# Patient Record
Sex: Female | Born: 1985 | Race: White | Hispanic: Yes | Marital: Married | Smoking: Never smoker
Health system: Southern US, Community
[De-identification: ages and names within clinical notes are randomized; demographics above are authoritative.]

## PROBLEM LIST (undated history)

## (undated) DIAGNOSIS — I959 Hypotension, unspecified: Secondary | ICD-10-CM

## (undated) HISTORY — DX: Hypotension, unspecified: I95.9

---

## 2008-06-28 ENCOUNTER — Encounter: Payer: Self-pay | Admitting: Family Medicine

## 2008-06-28 ENCOUNTER — Ambulatory Visit: Payer: Self-pay | Admitting: Family Medicine

## 2008-06-28 LAB — CONVERTED CEMR LAB
Antibody Screen: NEGATIVE
Basophils Relative: 0 % (ref 0–1)
Eosinophils Absolute: 0 10*3/uL (ref 0.0–0.7)
Eosinophils Relative: 0 % (ref 0–5)
MCHC: 32.2 g/dL (ref 30.0–36.0)
MCV: 91.9 fL (ref 78.0–100.0)
Monocytes Relative: 7 % (ref 3–12)
Neutrophils Relative %: 75 % (ref 43–77)
Platelets: 193 10*3/uL (ref 150–400)
RBC: 4.32 M/uL (ref 3.87–5.11)

## 2008-07-05 ENCOUNTER — Ambulatory Visit: Payer: Self-pay | Admitting: Family Medicine

## 2008-07-05 ENCOUNTER — Encounter: Payer: Self-pay | Admitting: Family Medicine

## 2008-07-05 LAB — CONVERTED CEMR LAB
Chlamydia, DNA Probe: NEGATIVE
Pap Smear: NORMAL

## 2008-08-03 ENCOUNTER — Ambulatory Visit: Payer: Self-pay | Admitting: Family Medicine

## 2008-08-03 LAB — CONVERTED CEMR LAB: Glucose, Urine, Semiquant: NEGATIVE

## 2008-08-31 ENCOUNTER — Ambulatory Visit: Payer: Self-pay | Admitting: Family Medicine

## 2008-08-31 ENCOUNTER — Encounter: Payer: Self-pay | Admitting: Family Medicine

## 2008-09-25 ENCOUNTER — Ambulatory Visit: Payer: Self-pay | Admitting: Family Medicine

## 2008-09-25 ENCOUNTER — Encounter: Payer: Self-pay | Admitting: Family Medicine

## 2008-10-22 ENCOUNTER — Encounter: Payer: Self-pay | Admitting: Family Medicine

## 2008-10-22 ENCOUNTER — Ambulatory Visit: Payer: Self-pay | Admitting: Family Medicine

## 2008-11-05 ENCOUNTER — Ambulatory Visit: Payer: Self-pay | Admitting: Family Medicine

## 2008-11-12 ENCOUNTER — Ambulatory Visit: Payer: Self-pay | Admitting: Family Medicine

## 2008-11-12 ENCOUNTER — Encounter: Payer: Self-pay | Admitting: Family Medicine

## 2008-11-19 ENCOUNTER — Encounter: Payer: Self-pay | Admitting: Family Medicine

## 2008-11-19 ENCOUNTER — Ambulatory Visit: Payer: Self-pay | Admitting: Family Medicine

## 2008-11-26 ENCOUNTER — Encounter: Payer: Self-pay | Admitting: Family Medicine

## 2008-11-26 ENCOUNTER — Ambulatory Visit: Payer: Self-pay | Admitting: Family Medicine

## 2008-11-26 DIAGNOSIS — Q638 Other specified congenital malformations of kidney: Secondary | ICD-10-CM

## 2008-11-28 ENCOUNTER — Encounter: Payer: Self-pay | Admitting: Family Medicine

## 2008-11-28 ENCOUNTER — Ambulatory Visit (HOSPITAL_COMMUNITY): Admission: RE | Admit: 2008-11-28 | Discharge: 2008-11-28 | Payer: Self-pay | Admitting: Family Medicine

## 2008-12-04 ENCOUNTER — Encounter: Payer: Self-pay | Admitting: Family Medicine

## 2008-12-04 ENCOUNTER — Ambulatory Visit: Payer: Self-pay | Admitting: Family Medicine

## 2008-12-04 LAB — CONVERTED CEMR LAB: GC Probe Amp, Genital: NEGATIVE

## 2008-12-11 ENCOUNTER — Ambulatory Visit: Payer: Self-pay | Admitting: Family Medicine

## 2008-12-11 ENCOUNTER — Encounter: Payer: Self-pay | Admitting: Family Medicine

## 2008-12-17 ENCOUNTER — Encounter: Payer: Self-pay | Admitting: Family Medicine

## 2008-12-17 ENCOUNTER — Ambulatory Visit: Payer: Self-pay | Admitting: Family Medicine

## 2008-12-17 DIAGNOSIS — Z22330 Carrier of Group B streptococcus: Secondary | ICD-10-CM | POA: Insufficient documentation

## 2008-12-19 ENCOUNTER — Ambulatory Visit (HOSPITAL_COMMUNITY): Admission: RE | Admit: 2008-12-19 | Discharge: 2008-12-19 | Payer: Self-pay | Admitting: Family Medicine

## 2008-12-19 ENCOUNTER — Encounter: Payer: Self-pay | Admitting: Family Medicine

## 2008-12-26 ENCOUNTER — Inpatient Hospital Stay (HOSPITAL_COMMUNITY): Admission: AD | Admit: 2008-12-26 | Discharge: 2008-12-28 | Payer: Self-pay | Admitting: Obstetrics & Gynecology

## 2008-12-26 ENCOUNTER — Ambulatory Visit: Payer: Self-pay | Admitting: Family Medicine

## 2009-02-06 ENCOUNTER — Ambulatory Visit: Payer: Self-pay | Admitting: Family Medicine

## 2009-04-30 ENCOUNTER — Ambulatory Visit: Payer: Self-pay | Admitting: Family Medicine

## 2009-04-30 DIAGNOSIS — K089 Disorder of teeth and supporting structures, unspecified: Secondary | ICD-10-CM | POA: Insufficient documentation

## 2011-01-21 ENCOUNTER — Encounter: Payer: Self-pay | Admitting: *Deleted

## 2011-03-16 LAB — CBC
HCT: 29.3 % — ABNORMAL LOW (ref 36.0–46.0)
Hemoglobin: 10 g/dL — ABNORMAL LOW (ref 12.0–15.0)
Hemoglobin: 12.4 g/dL (ref 12.0–15.0)
MCHC: 33.3 g/dL (ref 30.0–36.0)
MCV: 89.1 fL (ref 78.0–100.0)
RBC: 3.29 MIL/uL — ABNORMAL LOW (ref 3.87–5.11)
RBC: 4.19 MIL/uL (ref 3.87–5.11)
WBC: 11.9 10*3/uL — ABNORMAL HIGH (ref 4.0–10.5)
WBC: 9.3 10*3/uL (ref 4.0–10.5)

## 2011-03-16 LAB — RPR: RPR Ser Ql: NONREACTIVE

## 2013-11-30 NOTE — L&D Delivery Note (Signed)
Patient is 28 y.o. G2P1001 132w4d by redating 5230w2d sono admitted after precipitous delivery in car   Delivery Note At 5:45 PM a viable female was delivered via Vaginal, Spontaneous Delivery prior to presentation in car on way to MAU.  APGAR: 9, 9; weight  pending Placenta status: Intact, Spontaneous.  Cord: 3 vessels with the following complications:  Anesthesia: None  Episiotomy: None Lacerations: None Suture Repair: none Est. Blood Loss (mL):  200  Mom to postpartum.  Baby to Couplet care / Skin to Skin.  Rebecca Schneider ROCIO 11/01/2014, 7:07 PM

## 2014-03-07 ENCOUNTER — Emergency Department (INDEPENDENT_AMBULATORY_CARE_PROVIDER_SITE_OTHER)
Admission: EM | Admit: 2014-03-07 | Discharge: 2014-03-07 | Disposition: A | Discharge status: Home / Self Care | Payer: Self-pay | Source: Home / Self Care

## 2014-03-07 ENCOUNTER — Encounter (HOSPITAL_COMMUNITY): Payer: Self-pay | Admitting: Emergency Medicine

## 2014-03-07 DIAGNOSIS — K5900 Constipation, unspecified: Secondary | ICD-10-CM

## 2014-03-07 DIAGNOSIS — Z3201 Encounter for pregnancy test, result positive: Secondary | ICD-10-CM

## 2014-03-07 DIAGNOSIS — R109 Unspecified abdominal pain: Secondary | ICD-10-CM

## 2014-03-07 LAB — POCT URINALYSIS DIP (DEVICE)
Bilirubin Urine: NEGATIVE
GLUCOSE, UA: NEGATIVE mg/dL
Hgb urine dipstick: NEGATIVE
Ketones, ur: NEGATIVE mg/dL
LEUKOCYTES UA: NEGATIVE
NITRITE: NEGATIVE
Protein, ur: NEGATIVE mg/dL
Specific Gravity, Urine: 1.015 (ref 1.005–1.030)
UROBILINOGEN UA: 0.2 mg/dL (ref 0.0–1.0)
pH: 7 (ref 5.0–8.0)

## 2014-03-07 LAB — POCT PREGNANCY, URINE: PREG TEST UR: POSITIVE — AB

## 2014-03-07 MED ORDER — PRENATAL VITAMINS 28-0.8 MG PO TABS: ORAL_TABLET | ORAL | Status: DC

## 2014-03-07 NOTE — ED Provider Notes (Signed)
CSN: [id number]     Arrival date & time 03/07/14  1054 History   First MD Initiated Contact with Patient 03/07/14 1231     Chief Complaint  Patient presents with  . Possible Pregnancy   (Consider location/radiation/quality/duration/timing/severity/associated sxs/prior Treatment) HPI Comments: As above. Only complaint is lower abdominal cramping for several days. Has not has a nl BM for several days. No pelvic pain or bleeding.    History reviewed. No pertinent past medical history. History reviewed. No pertinent past surgical history. No family history on file. History  Substance Use Topics  . Smoking status: Never Smoker   . Smokeless tobacco: Not on file  . Alcohol Use: No   OB History   Grav Para Term Preterm Abortions TAB SAB Ect Mult Living                 Review of Systems  Constitutional: Negative.   Gastrointestinal: Positive for abdominal pain, diarrhea and constipation. Negative for vomiting.       As per history of present illness  Genitourinary: Negative.  Negative for dysuria, frequency and pelvic pain.  Skin: Negative for rash.    Allergies  Review of patient's allergies indicates no known allergies.  Home Medications   Current Outpatient Rx  Name  Route  Sig  Dispense  Refill  . amoxicillin (AMOXIL) 500 MG tablet   Oral   Take 500 mg by mouth 2 (two) times daily.           . norethindrone (ORTHO MICRONOR) 0.35 MG tablet   Oral   Take 1 tablet by mouth daily.           . Prenatal Vit-Fe Fumarate-FA (GNP PRENATAL VITAMINS PO)   Oral   Take 1 tablet by mouth 2 (two) times daily.           . Prenatal Vit-Fe Fumarate-FA (PRENATAL VITAMINS) 28-0.8 MG TABS      1 po q d   100 tablet   0    BP 99/57  Pulse 72  Temp(Src) 98.2 F (36.8 C) (Oral)  Resp 18  SpO2 100%  LMP 01/14/2014 Physical Exam  Nursing note and vitals reviewed. Constitutional: She is oriented to person, place, and time. She appears well-developed and well-nourished.   Pulmonary/Chest: Effort normal. No respiratory distress.  Abdominal: Soft. Bowel sounds are normal. She exhibits no distension and no mass. There is no rebound and no guarding.  Minor tenderness across lower abdomen. No pelvic tenderness.  Musculoskeletal: She exhibits no edema and no tenderness.  Neurological: She is alert and oriented to person, place, and time.  Skin: Skin is warm and dry. No pallor.    ED Course  Procedures (including critical care time) Labs Review Labs Reviewed  POCT PREGNANCY, URINE - Abnormal; Notable for the following:    Preg Test, Ur POSITIVE (*)    All other components within normal limits  POCT URINALYSIS DIP (DEVICE)   Imaging Review No results found.   MDM   1. Positive pregnancy test   2. Constipation      Miralax and plenty of liquids Prenatal vit. F/U with OB, clal for appt For problems, bleeding , pain [facility] other [facility]    Sidonie Dexheimer, NP 03/07/14 1307

## 2014-03-07 NOTE — ED Notes (Signed)
Pt is here for poss preg LMP = 01/14/14??? Hx of irregular periods Will have occasional abd cramps.  Alert w/no signs of acute distress.

## 2014-03-07 NOTE — Discharge Instructions (Signed)
Constipacin (Constipation) Se llama constipacin cuando:   Elimina heces (mueve el intestino) menos de 3 veces por semana.  Tiene dificultad para mover el intestino.  Las heces son secas y duras o son ms grandes que lo normal. CUIDADOS EN EL HOGAR   Consuma ms fibra que se encuentra en frutas, verduras y granos enteros como arroz integral y frijoles.  Consuma menos alimentos ricos en grasas y azcar. Estos incluyen patatas fritas, hamburguesas, galletas, dulces y refrescos.  Si no consume suficientes alimentos ricos en fibras, tome productos que tengan agregado de fibra (suplementos).  Beba gran cantidad de lquido para mantener el pis (orina) de tono claro o amarillo plido.  Vaya al bao cuando sienta la necesidad de ir. No espere.  Slo debe tomar los [employer] se los han recetado. No tome medicamentos que le ayuden a Licensed conveyancer intestino (laxantes) sin antes consultarlo con su mdico.  Haga ejercicio en forma regular, o como lo indique su mdico. SOLICITE AYUDA DE INMEDIATO SI:   Observa sangre brillante en las heces (materia fecal).  El estreimiento dura ms de 4 das o [place].  Siente dolor en el vientre (abdomen) o en el ano (recto).  Las heces son delgadas (como un lpiz).  Pierde peso de [place] inexplicable. ASEGRESE DE QUE:   Comprende estas instrucciones.  Controlar su enfermedad.  Solicitar ayuda de inmediato si no mejora o si empeora. Document Released: 12/19/2010 Document Revised: 02/08/2012 [facility] Patient Information 2014 [place], [place].  Contenido de [place] de los alimentos ([employer] in Foods) Beber lquidos en abundancia y consumir alimentos ricos en fibra ayuda a combatir la constipacin. A continuacin podr observar el contenido de fibra de algunos alimentos.  Almidones y granos / Media planner (g)  Cheerios, 1 taza / 3 g  [employer], 1 taza / 0.7 g  Rice Krispies, 1  taza / 0.3 g  Quaker Oat Life Cereal,   taza / 2.1 g  Avena instantnea (cocida),  taza / 2 g  Kellogg's Frosted Mini Wheats, 1 taza / 5.1 g  Arroz marrn grano largo (cocido), 1 taza / 3,5 g  Arroz blanco grano largo (cocido), 1 taza / 0,6 g  Macarrones enriquecidos (cocidos), 1 taza / 2,5 g Legumbres / Fibra Diettica (g)  Frijoles cocidos (enlatados, crudos o vegetarianos),  taza / 5,2 g  Frijoles rin, enlatados,  taza / 6,8 g  Judas pinto, (cocidas),  taza / 7,7 g  Frijoles pintos (enlatados),  taza / 5,5 g Panes y Gaffer / Media planner (g)  Galletas de [place], simples o de miel, 2 cuadrados / 0,7 g  Galletas saladas, 3 unidades / 0,3 g  Pretzels, sencillos, salados, 10 trozos / 1,8 g  Pan integral, 1 rebanada / 1,9 g  Pan blanco, 1 rebanada / 0,7 g  Pan con pasas, 1 rebanada / 1,2 g  Bagel, sencillo, 3 onzas / 2 g  Tortilla de harina, 1 onza / 0,9 g  Tortilla de maz, 1 pequea / 1,5 g  Bun, hamburguesa o hot dog, 1 pequeo / 0,9 g Frutas / Fibra diettica (g)  Manzana, cruda con piel, 1 mediana / 4,4 g  Pur de manzanas, endulzado  taza / 1,5 g  [place],  mediano / 1,5 g  Uvas, 10 uvas / 0,4 g  Naranja, 1 pequea / 2,3 g  Pasas, 1,5 oz / 1,6 g  Meln, 1 taza / 1,4 g Vegetales / Fibra Diettica (g)  Judas verdes (en conserva),  taza /  1,3 g  Zanahorias (cocido),  taza / 2,3 g  Broccoli (cocido),  taza / 2,8 g  Guisantes congelados (cocidos),  taza / 4,4 g  Pur de papas,  taza / 1,6 g  [place], 1 taza / 0,5 g  Maz (en lata),  taza / 1,6 g  Tomate,  taza / 1,1 g 1 cup / 3 g. Document Released: 03/13/2013 [facility] Patient Information 2014 [place], [place].  Pruebas de Psychiatrist  (Pregnancy Tests) CMO FUNCIONAN LAS PRUEBAS DE [facility]?  Todas las pruebas de Psychiatrist buscan una hormona especial en la orina o en la sangre que slo est presente en las mujeres embarazadas. Esta hormona, la gonadotropina corinica humana (hCG), tambin se conoce como la  hormona del [place].  CUL ES LA DIFERENCIA ENTRE UNA PRUEBA DE EMBARAZO DE ORINA Y UNA PRUEBA EMBARAZO EN SANGRE? ES UNA MEJOR QUE LA OTRA?  Hay dos tipos de pruebas de [place].   Pruebas de [place].  Pruebas de [place]. En ambas se busca la presencia de hCG, la hormona del embarazo. Muchas mujeres se hacen una prueba de [place] o una prueba casera de embarazo (HPT) para averiguar si estn embarazadas. Las HPT son econmicas, fciles de usar, se pueden hacer en casa y son privadas. Cuando una mujer tiene un resultado positivo en una HPT, debe ver a su mdico inmediatamente. El mdico confirmar el resultado positivo de la HPT con otro anlisis de [place], anlisis de [place], [place] y un examen plvico.  Hay dos tipos de anlisis de sangre que el mdico puede indicar.   Un anlisis de sangre cuantitativo (o prueba de hCG beta). Esta prueba mide la cantidad exacta de hCG en la sangre Esto significa que puede detectar cantidades muy pequeas de hCG, por lo que es una prueba muy precisa.  Anlisis de sangre hCG cualitativo. Esta prueba slo confirma o no el embarazo. Se asemeja ms a la prueba de [place] en cuanto a su exactitud. Los [employer] Corporation de sangre pueden Engineer, manufacturing la hCG antes que las pruebas de [place]. Los [employer] Corporation de sangre pueden Engineer, manufacturing un Psychiatrist de unos 6 a 8 das despus de liberar un vulo del ovario (ovular). Los anlisis de [place] pueden Land alrededor de 2 semanas despus de la ovulacin. Algunas pruebas de orina ms sensibles pueden decir si usted est embarazada hasta 6 das o an hasta 1 da despus de la falta del perodo menstrual. CMO SE HACE UNA PRUEBA DE EMBARAZO EN CASA?  Hay muchos tipos de pruebas de embarazo caseras o HPT que se pueden comprar sin receta en las farmacias.   Algunas consisten en recolectar la orina en un recipiente y sumergir una tira reactiva en la orina y otros en poner un poco de orina en un recipiente especial con un gotero.  En otros  casos se coloca una tira reactiva en el flujo de orina.  Las pruebas varan segn el tiempo de espera en que la tira reactiva o el contenedor tomen un cierto color o aparezca un smbolo en ella (como un smbolo ms o un smbolo menos).  Todos los envases vienen con instrucciones escritas. La mayora de los envases tambin traen nmeros de telfono gratuitos para Freight forwarder en caso de duda sobre cmo hacer la prueba o leer los [place]. QU PRECISIN TIENEN LAS PRUEBAS DE EMBARAZO CASERAS?  Las HPT son muy precisas. La Harley-Davidson de las marcas de HPTs afirman que tienen entre el 97% y 99% de precisin cuando se realizan 1 semana despus de la  falta del perodo menstrual, pero esto puede variar con el uso real. Cada marca vara en el grado de sensibilidad para detectar la hormona del embarazo hCG. Si la prueba no se realiza correctamente, ser Apple Computermenos precisa. Controle siempre el envase para asegurarse de que no ha pasado la fecha de vencimiento. En este caso no ser segura. La mayora de las marcas aconsejan volver a Radio producerhacer la prueba despus de [address] D[address] Roadalgunos das, no importa cules sean los [place] Southresultados.  Si hace la prueba demasiado temprano en el embarazo, puede ocurrir que no haya suficiente cantidad de hormona del embarazo hCG en la orina para obtener un resultado positivo. La mayora de HPTs sern precisos si realiza la prueba en la poca de la primera falta (aproximadamente 2 semanas despus de la ovulacin). Si no est embarazada, o si ha ovulado antes de lo que supona, obtendr un resultado negativo. Tambin puede haber problemas en el embarazo que afecten la cantidad de hCG que hay en la orina. Si el HPT es negativo, repita la prueba en unos das o en [place]una semana. Si an el resultado es negativo y piensa que est embarazada, hable con su mdico inmediatamente acerca de cmo realizar una prueba de Retail bankerembarazo en sangre.  PRUEBA DE EMBARAZO FALSO POSITIVA  Una HPT falso positiva ocurre cuando hay sangre o protenas en  la orina. Puede haber falso positivo tambin si estuvo embarazada recientemente o si realiza la prueba de embarazo antes de [place], despus de haber utilizado un medicamento para la fertilidad que contenga hCG. Adems, algunos medicamentos recetados, como los diurticos, tranquilizantes, medicamentos psiquitricos, para las convulsiones, la [place]alergia, las nuseas (prometazina) dan falsos positivos.  PRUEBA DE EMBARAZO FALSO NEGATIVA   Una HPT falso negativa puede ocurrir si realiza la prueba Lear Corporationdemasiado pronto. Trate de esperar hasta que tenga al menos 1 da de retraso de su periodo menstrual.  Tambin puede ocurrir si espera demasiado tiempo para utilizar la orina ( ms de 15 minutos).  Tambin si la orina est demasiado diluida por haber bebido mucho lquido antes de obtener la [place] Antillesmuestra de orina. Lo mejor es Boston Scientificutilizar la primera orina de la maana despus de levantarse de la cama. Si su periodo menstrual no se inici despus de una semana de un HPT negativa, repita la prueba de embarazo.  CUALQUIER CIRCUNSTANCIA PUEDE INTERFERIR CON LOS RESULTADOS DEL TEST DE EMBARAZO?  La Harley-Davidsonmayora de los medicamentos de venta libre como los recetados, incluyendo las pldoras anticonceptivas y los antibiticos, no Geologist, engineeringdeberan afectar los resultados de [place] HPT. Slo los medicamentos que contienen hormona del embarazo hCG pueden dar un resultado falso positivo. Los medicamentos que contienen hCG se pueden usar para el tratamiento de la infertilidad (imposibilidad para quedar embarazada). El alcohol y las drogas no afectan los [place]resultados del HPT, pero no debera usar estas sustancias si est tratando de quedar embarazada. Si usted tiene una prueba de [place]tonembarazo positiva, llame a su mdico para hacer una cita y comenzar el cuidado prenatal. Document Released: 11/02/2012 [facility] Patient Information 2014 [place], [place].

## 2014-03-08 NOTE — ED Provider Notes (Signed)
Medical screening examination/treatment/procedure(s) were performed by non-physician practitioner and as supervising physician I was immediately available for consultation/collaboration.  Daivion Pape, M.D.  Ardel Jagger C Sahira Cataldi, MD 03/08/14 0752 

## 2014-04-04 ENCOUNTER — Ambulatory Visit (INDEPENDENT_AMBULATORY_CARE_PROVIDER_SITE_OTHER): Payer: No Typology Code available for payment source | Admitting: Advanced Practice Midwife

## 2014-04-04 ENCOUNTER — Encounter: Payer: Self-pay | Admitting: Advanced Practice Midwife

## 2014-04-04 VITALS — BP 94/57 | HR 85 | Temp 97.1°F | Wt 119.8 lb

## 2014-04-04 DIAGNOSIS — Z3490 Encounter for supervision of normal pregnancy, unspecified, unspecified trimester: Secondary | ICD-10-CM

## 2014-04-04 DIAGNOSIS — Z3687 Encounter for antenatal screening for uncertain dates: Secondary | ICD-10-CM

## 2014-04-04 DIAGNOSIS — Z1389 Encounter for screening for other disorder: Secondary | ICD-10-CM

## 2014-04-04 DIAGNOSIS — Z3480 Encounter for supervision of other normal pregnancy, unspecified trimester: Secondary | ICD-10-CM

## 2014-04-04 DIAGNOSIS — Z23 Encounter for immunization: Secondary | ICD-10-CM

## 2014-04-04 LAB — POCT URINALYSIS DIP (DEVICE)
Bilirubin Urine: NEGATIVE
GLUCOSE, UA: NEGATIVE mg/dL
Hgb urine dipstick: NEGATIVE
Ketones, ur: NEGATIVE mg/dL
Leukocytes, UA: NEGATIVE
Nitrite: NEGATIVE
PH: 7.5 (ref 5.0–8.0)
PROTEIN: NEGATIVE mg/dL
SPECIFIC GRAVITY, URINE: 1.02 (ref 1.005–1.030)
Urobilinogen, UA: 0.2 mg/dL (ref 0.0–1.0)

## 2014-04-04 NOTE — Progress Notes (Signed)
New OB.  See Smart Set   Subjective:    Rebecca Schneider is a G2P1001 3862w4d being seen today for her first obstetrical visit.  Her obstetrical history is significant for previous baby with bilateral hydronephrosis. Patient does intend to breast feed. Pregnancy history fully reviewed.  Patient reports no complaints.  Dates unsure.  Never had regular periods prior to pregnancy.   Filed Vitals:   04/04/14 0930  BP: 94/57  Pulse: 85  Temp: 97.1 F (36.2 C)  Weight: 119 lb 12.8 oz (54.341 kg)    HISTORY: OB History  Gravida Para Term Preterm AB SAB TAB Ectopic Multiple Living  2 1 1       1     # Outcome Date GA Lbr Len/2nd Weight Sex Delivery Anes PTL Lv  2 CUR           1 TRM 12/26/08 6045w0d  7 lb 2 oz (3.232 kg) M SVD EPI  Y     History reviewed. No pertinent past medical history. History reviewed. No pertinent past surgical history. Family History  Problem Relation Age of Onset  . Cancer Brother     neck  . Diabetes Maternal Grandfather      Exam    Uterus:  Fundal Height: 12 cm  Uterus measures 10-12 weeks  Pelvic Exam:    Perineum: No Hemorrhoids, Normal Perineum   Vulva: Bartholin's, Urethra, Skene's normal   Vagina:  normal mucosa, normal discharge   pH:    Cervix: no cervical motion tenderness   Adnexa: no mass, fullness, tenderness   Bony Pelvis: gynecoid  System: Breast:  normal appearance, no masses or tenderness   Skin: normal coloration and turgor, no rashes    Neurologic: oriented, grossly non-focal   Extremities: normal strength, tone, and muscle mass   HEENT neck supple with midline trachea   Mouth/Teeth mucous membranes moist, pharynx normal without lesions   Neck supple and no masses   Cardiovascular: regular rate and rhythm, no murmurs or gallops   Respiratory:  appears well, vitals normal, no respiratory distress, acyanotic, normal RR, ear and throat exam is normal, neck free of mass or lymphadenopathy, chest clear, no wheezing, crepitations,  rhonchi, normal symmetric air entry   Abdomen: soft, non-tender; bowel sounds normal; no masses,  no organomegaly   Urinary: urethral meatus normal      Assessment:    Pregnancy: G2P1001 Patient Active Problem List   Diagnosis Date Noted  . DENTAL PAIN 04/30/2009  . VAGINAL DELIVERY, WITH SECOND-DEGREE PERINEAL LACERATION 04/30/2009  . 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 Prenatal vitamins. Problem list reviewed and updated. Genetic Screening discussed First Screen: declined.  Ultrasound discussed; fetal survey: requested. In order to confirm dates  Follow up in 4 weeks. 50% of 30 min visit spent on counseling and coordination of care.     Aviva SignsMarie L Yancarlos Berthold 04/04/2014

## 2014-04-04 NOTE — Progress Notes (Signed)
In the past weeks continuous pain in lower back and goes around to the front.

## 2014-04-04 NOTE — Patient Instructions (Signed)
Pregnancy - First Trimester  During sexual intercourse, millions of sperm go into the vagina. Only 1 sperm will penetrate and fertilize the female egg while it is in the Fallopian tube. One week later, the fertilized egg implants into the wall of the uterus. An embryo begins to develop into a baby. At 6 to 8 weeks, the eyes and face are formed and the heartbeat can be seen on ultrasound. At the end of 12 weeks (first trimester), all the baby's organs are formed. Now that you are pregnant, you will want to do everything you can to have a healthy baby. Two of the most important things are to get good prenatal care and follow your caregiver's instructions. Prenatal care is all the medical care you receive before the baby's birth. It is given to prevent, find, and treat problems during the pregnancy and childbirth.  PRENATAL EXAMS  · During prenatal visits, your weight, blood pressure, and urine are checked. This is done to make sure you are healthy and progressing normally during the pregnancy.  · A pregnant woman should gain 25 to 35 pounds during the pregnancy. However, if you are overweight or underweight, your caregiver will advise you regarding your weight.  · Your caregiver will ask and answer questions for you.  · Blood work, cervical cultures, other necessary tests, and a Pap test are done during your prenatal exams. These tests are done to check on your health and the probable health of your baby. Tests are strongly recommended and done for HIV with your permission. This is the virus that causes AIDS. These tests are done because medicines can be given to help prevent your baby from being born with this infection should you have been infected without knowing it. Blood work is also used to find out your blood type, previous infections, and follow your blood levels (hemoglobin).  · Low hemoglobin (anemia) is common during pregnancy. Iron and vitamins are given to help prevent this. Later in the pregnancy, blood  tests for diabetes will be done along with any other tests if any problems develop.  · You may need other tests to make sure you and the baby are doing well.  CHANGES DURING THE FIRST TRIMESTER   Your body goes through many changes during pregnancy. They vary from person to person. Talk to your caregiver about changes you notice and are concerned about. Changes can include:  · Your menstrual period stops.  · The egg and sperm carry the genes that determine what you look like. Genes from you and your partner are forming a baby. The female genes determine whether the baby is a boy or a girl.  · Your body increases in girth and you may feel bloated.  · Feeling sick to your stomach (nauseous) and throwing up (vomiting). If the vomiting is uncontrollable, call your caregiver.  · Your breasts will begin to enlarge and become tender.  · Your nipples may stick out more and become darker.  · The need to urinate more. Painful urination may mean you have a bladder infection.  · Tiring easily.  · Loss of appetite.  · Cravings for certain kinds of food.  · At first, you may gain or lose a couple of pounds.  · You may have changes in your emotions from day to day (excited to be pregnant or concerned something may go wrong with the pregnancy and baby).  · You may have more vivid and strange dreams.  HOME CARE INSTRUCTIONS   ·   It is very important to avoid all smoking, alcohol and non-prescribed drugs during your pregnancy. These affect the formation and growth of the baby. Avoid chemicals while pregnant to ensure the delivery of a healthy infant.  · Start your prenatal visits by the 12th week of pregnancy. They are usually scheduled monthly at first, then more often in the last 2 months before delivery. Keep your caregiver's appointments. Follow your caregiver's instructions regarding medicine use, blood and lab tests, exercise, and diet.  · During pregnancy, you are providing food for you and your baby. Eat regular, well-balanced  meals. Choose foods such as meat, fish, milk and other low fat dairy products, vegetables, fruits, and whole-grain breads and cereals. Your caregiver will tell you of the ideal weight gain.  · You can help morning sickness by keeping soda crackers at the bedside. Eat a couple before arising in the morning. You may want to use the crackers without salt on them.  · Eating 4 to 5 small meals rather than 3 large meals a day also may help the nausea and vomiting.  · Drinking liquids between meals instead of during meals also seems to help nausea and vomiting.  · A physical sexual relationship may be continued throughout pregnancy if there are no other problems. Problems may be early (premature) leaking of amniotic fluid from the membranes, vaginal bleeding, or belly (abdominal) pain.  · Exercise regularly if there are no restrictions. Check with your caregiver or physical therapist if you are unsure of the safety of some of your exercises. Greater weight gain will occur in the last 2 trimesters of pregnancy. Exercising will help:  · Control your weight.  · Keep you in shape.  · Prepare you for labor and delivery.  · Help you lose your pregnancy weight after you deliver your baby.  · Wear a good support or jogging bra for breast tenderness during pregnancy. This may help if worn during sleep too.  · Ask when prenatal classes are available. Begin classes when they are offered.  · Do not use hot tubs, steam rooms, or saunas.  · Wear your seat belt when driving. This protects you and your baby if you are in an accident.  · Avoid raw meat, uncooked cheese, cat litter boxes, and soil used by cats throughout the pregnancy. These carry germs that can cause birth defects in the baby.  · The first trimester is a good time to visit your dentist for your dental health. Getting your teeth cleaned is okay. Use a softer toothbrush and brush gently during pregnancy.  · Ask for help if you have financial, counseling, or nutritional needs  during pregnancy. Your caregiver will be able to offer counseling for these needs as well as refer you for other special needs.  · Do not take any medicines or herbs unless told by your caregiver.  · Inform your caregiver if there is any mental or physical domestic violence.  · Make a list of emergency phone numbers of family, friends, hospital, and police and fire departments.  · Write down your questions. Take them to your prenatal visit.  · Do not douche.  · Do not cross your legs.  · If you have to stand for long periods of time, rotate you feet or take small steps in a circle.  · You may have more vaginal secretions that may require a sanitary pad. Do not use tampons or scented sanitary pads.  MEDICINES AND DRUG USE IN PREGNANCY  ·   Take prenatal vitamins as directed. The vitamin should contain 1 milligram of folic acid. Keep all vitamins out of reach of children. Only a couple vitamins or tablets containing iron may be fatal to a baby or young child when ingested.  · Avoid use of all medicines, including herbs, over-the-counter medicines, not prescribed or suggested by your caregiver. Only take over-the-counter or prescription medicines for pain, discomfort, or fever as directed by your caregiver. Do not use aspirin, ibuprofen, or naproxen unless directed by your caregiver.  · Let your caregiver also know about herbs you may be using.  · Alcohol is related to a number of birth defects. This includes fetal alcohol syndrome. All alcohol, in any form, should be avoided completely. Smoking will cause low birth rate and premature babies.  · Street or illegal drugs are very harmful to the baby. They are absolutely forbidden. A baby born to an addicted mother will be addicted at birth. The baby will go through the same withdrawal an adult does.  · Let your caregiver know about any medicines that you have to take and for what reason you take them.  SEEK MEDICAL CARE IF:   You have any concerns or worries during your  pregnancy. It is better to call with your questions if you feel they cannot wait, rather than worry about them.  SEEK IMMEDIATE MEDICAL CARE IF:   · An unexplained oral temperature above 102° F (38.9° C) develops, or as your caregiver suggests.  · You have leaking of fluid from the vagina (birth canal). If leaking membranes are suspected, take your temperature and inform your caregiver of this when you call.  · There is vaginal spotting or bleeding. Notify your caregiver of the amount and how many pads are used.  · You develop a bad smelling vaginal discharge with a change in the color.  · You continue to feel sick to your stomach (nauseated) and have no relief from remedies suggested. You vomit blood or coffee ground-like materials.  · You lose more than 2 pounds of weight in 1 week.  · You gain more than 2 pounds of weight in 1 week and you notice swelling of your face, hands, feet, or legs.  · You gain 5 pounds or more in 1 week (even if you do not have swelling of your hands, face, legs, or feet).  · You get exposed to German measles and have never had them.  · You are exposed to fifth disease or chickenpox.  · You develop belly (abdominal) pain. Round ligament discomfort is a common non-cancerous (benign) cause of abdominal pain in pregnancy. Your caregiver still must evaluate this.  · You develop headache, fever, diarrhea, pain with urination, or shortness of breath.  · You fall or are in a car accident or have any kind of trauma.  · There is mental or physical violence in your home.  Document Released: 11/10/2001 Document Revised: 08/10/2012 Document Reviewed: 05/14/2009  ExitCare® Patient Information ©2014 [employer].

## 2014-04-05 ENCOUNTER — Encounter: Payer: Self-pay | Admitting: Advanced Practice Midwife

## 2014-04-05 DIAGNOSIS — D696 Thrombocytopenia, unspecified: Secondary | ICD-10-CM | POA: Insufficient documentation

## 2014-04-05 LAB — OBSTETRIC PANEL
ANTIBODY SCREEN: NEGATIVE
BASOS ABS: 0 10*3/uL (ref 0.0–0.1)
Basophils Relative: 0 % (ref 0–1)
EOS PCT: 0 % (ref 0–5)
Eosinophils Absolute: 0 10*3/uL (ref 0.0–0.7)
HEMATOCRIT: 38.6 % (ref 36.0–46.0)
HEMOGLOBIN: 13.2 g/dL (ref 12.0–15.0)
Hepatitis B Surface Ag: NEGATIVE
LYMPHS PCT: 24 % (ref 12–46)
Lymphs Abs: 1.9 10*3/uL (ref 0.7–4.0)
MCH: 29.7 pg (ref 26.0–34.0)
MCHC: 34.2 g/dL (ref 30.0–36.0)
MCV: 86.7 fL (ref 78.0–100.0)
MONO ABS: 0.5 10*3/uL (ref 0.1–1.0)
MONOS PCT: 6 % (ref 3–12)
NEUTROS ABS: 5.5 10*3/uL (ref 1.7–7.7)
Neutrophils Relative %: 70 % (ref 43–77)
Platelets: 200 10*3/uL (ref 150–400)
RBC: 4.45 MIL/uL (ref 3.87–5.11)
RDW: 13.9 % (ref 11.5–15.5)
RH TYPE: POSITIVE
RUBELLA: 21.7 {index} — AB (ref <0.90)
WBC: 7.8 10*3/uL (ref 4.0–10.5)

## 2014-04-05 LAB — HIV ANTIBODY (ROUTINE TESTING W REFLEX): HIV: NONREACTIVE

## 2014-04-06 LAB — HEMOGLOBINOPATHY EVALUATION
HGB F QUANT: 0 % (ref 0.0–2.0)
Hemoglobin Other: 0 %
Hgb A2 Quant: 2.6 % (ref 2.2–3.2)
Hgb A: 97.4 % (ref 96.8–97.8)
Hgb S Quant: 0 %

## 2014-04-07 LAB — CULTURE, OB URINE

## 2014-04-08 ENCOUNTER — Other Ambulatory Visit: Payer: Self-pay | Admitting: Advanced Practice Midwife

## 2014-04-08 DIAGNOSIS — N390 Urinary tract infection, site not specified: Secondary | ICD-10-CM

## 2014-04-08 MED ORDER — CEPHALEXIN 500 MG PO CAPS: 500.0000 mg | ORAL_CAPSULE | Freq: Four times a day (QID) | ORAL | Status: DC

## 2014-04-09 ENCOUNTER — Telehealth: Payer: Self-pay

## 2014-04-09 MED ORDER — CEPHALEXIN 500 MG PO CAPS: 500.0000 mg | ORAL_CAPSULE | Freq: Four times a day (QID) | ORAL | Status: AC

## 2014-04-09 NOTE — Telephone Encounter (Signed)
Medication could not e-prescribe because there is no pharmacy on file for patient. Attempted to call patient with [facility] [phone]. Informed pt. Of results and asked which pharmacy she would prefer-- pt. Stated [facility] on [address]. Medication e-prescribed. Pt. Verbalized understanding of results. No questions or concerns.

## 2014-04-09 NOTE — Telephone Encounter (Signed)
Message copied by Louanna RawAMPBELL, Sidra Oldfield M on Mon Apr 09, 2014  2:02 PM ------      Message from: [place], [place] L      Created: Sun Apr 08, 2014  5:51 AM      Regarding: UTI tx       UTI with E Coli            I put in order for Keflex 500mg  qid x 7 d but would not eprescribe            Can you call her?            Thanks      Hilda LiasMarie ------

## 2014-04-10 ENCOUNTER — Ambulatory Visit (HOSPITAL_COMMUNITY)
Admission: RE | Admit: 2014-04-10 | Discharge: 2014-04-10 | Disposition: A | Discharge status: Home / Self Care | Payer: No Typology Code available for payment source | Source: Ambulatory Visit | Attending: Advanced Practice Midwife | Admitting: Advanced Practice Midwife

## 2014-04-10 DIAGNOSIS — Z3490 Encounter for supervision of normal pregnancy, unspecified, unspecified trimester: Secondary | ICD-10-CM

## 2014-04-10 DIAGNOSIS — O208 Other hemorrhage in early pregnancy: Secondary | ICD-10-CM | POA: Insufficient documentation

## 2014-04-10 DIAGNOSIS — Z3689 Encounter for other specified antenatal screening: Secondary | ICD-10-CM | POA: Insufficient documentation

## 2014-05-02 ENCOUNTER — Encounter: Payer: Self-pay | Admitting: Advanced Practice Midwife

## 2014-05-02 ENCOUNTER — Ambulatory Visit (INDEPENDENT_AMBULATORY_CARE_PROVIDER_SITE_OTHER): Payer: No Typology Code available for payment source | Admitting: Advanced Practice Midwife

## 2014-05-02 VITALS — BP 97/60 | HR 70 | Wt 118.6 lb

## 2014-05-02 DIAGNOSIS — Z3480 Encounter for supervision of other normal pregnancy, unspecified trimester: Secondary | ICD-10-CM

## 2014-05-02 DIAGNOSIS — IMO0001 Reserved for inherently not codable concepts without codable children: Secondary | ICD-10-CM | POA: Insufficient documentation

## 2014-05-02 DIAGNOSIS — Z22330 Carrier of Group B streptococcus: Secondary | ICD-10-CM

## 2014-05-02 LAB — POCT URINALYSIS DIP (DEVICE)
Bilirubin Urine: NEGATIVE
GLUCOSE, UA: NEGATIVE mg/dL
Hgb urine dipstick: NEGATIVE
Ketones, ur: NEGATIVE mg/dL
Leukocytes, UA: NEGATIVE
NITRITE: NEGATIVE
Protein, ur: NEGATIVE mg/dL
Specific Gravity, Urine: 1.015 (ref 1.005–1.030)
UROBILINOGEN UA: 0.2 mg/dL (ref 0.0–1.0)
pH: 7 (ref 5.0–8.0)

## 2014-05-02 NOTE — Progress Notes (Signed)
Doing well.  Denies vaginal bleeding, LOF, contractions/contractions.  Pt reports frequent headaches, is drinking 3 small bottles of water/day.  Recommend increase in water intake.  Abdominal soreness, bilateral inguinal areas. Tender to palpation on exam. Rest/ice/heat/Tylenol for round ligament pain.  Urine sent for culture.

## 2014-05-02 NOTE — Progress Notes (Signed)
Used language line.  

## 2014-05-04 LAB — CULTURE, OB URINE: Colony Count: 8000

## 2014-05-30 ENCOUNTER — Encounter: Payer: Self-pay | Admitting: Advanced Practice Midwife

## 2014-05-30 ENCOUNTER — Ambulatory Visit (INDEPENDENT_AMBULATORY_CARE_PROVIDER_SITE_OTHER): Payer: No Typology Code available for payment source | Admitting: Advanced Practice Midwife

## 2014-05-30 VITALS — BP 88/57 | HR 75 | Temp 97.2°F | Wt 119.1 lb

## 2014-05-30 DIAGNOSIS — Z3480 Encounter for supervision of other normal pregnancy, unspecified trimester: Secondary | ICD-10-CM

## 2014-05-30 DIAGNOSIS — N949 Unspecified condition associated with female genital organs and menstrual cycle: Secondary | ICD-10-CM

## 2014-05-30 DIAGNOSIS — Z3492 Encounter for supervision of normal pregnancy, unspecified, second trimester: Secondary | ICD-10-CM

## 2014-05-30 LAB — POCT URINALYSIS DIP (DEVICE)
Bilirubin Urine: NEGATIVE
Glucose, UA: NEGATIVE mg/dL
HGB URINE DIPSTICK: NEGATIVE
KETONES UR: NEGATIVE mg/dL
Leukocytes, UA: NEGATIVE
Nitrite: NEGATIVE
PH: 7 (ref 5.0–8.0)
Protein, ur: NEGATIVE mg/dL
SPECIFIC GRAVITY, URINE: 1.02 (ref 1.005–1.030)
UROBILINOGEN UA: 0.2 mg/dL (ref 0.0–1.0)

## 2014-05-30 NOTE — Progress Notes (Signed)
Pt reports cramping and pressure in legs and lower abdomen.  States she does not feel movement from the baby,  Just pain.

## 2014-05-30 NOTE — Progress Notes (Signed)
C/O round ligament pain at times. Discussed. Also discussed may not feel movement for a couple more weeks.  Plan US in 2 weeks.

## 2014-05-30 NOTE — Patient Instructions (Signed)
Segundo trimestre de embarazo (Second Trimester of Pregnancy) El segundo trimestre va desde la semana13 hasta la 28, desde el cuarto hasta el sexto mes, y suele ser el momento en el que mejor se siente. Su organismo se ha adaptado a estar embarazada y comienza a sentirse fsicamente mejor. En general, las nuseas matutinas han disminuido o han desaparecido completamente, p El segundo trimestre es tambin la poca en la que el feto se desarrolla rpidamente. Hacia el final del sexto mes, el feto mide aproximadamente 9pulgadas (23cm) y pesa alrededor de 1 libras (700g). Es probable que sienta que el beb se mueve (da pataditas) entre las 18 y 20semanas del embarazo. CAMBIOS EN EL ORGANISMO Su organismo atraviesa por muchos cambios durante el embarazo, y estos varan de una mujer a otra.   Seguir aumentando de peso. Notar que la parte baja del abdomen sobresale.  Podrn aparecer las primeras estras en las caderas, el abdomen y las mamas.  Es posible que tenga dolores de cabeza que pueden aliviarse con los medicamentos que su mdico autorice.  Tal vez tenga necesidad de orinar con ms frecuencia porque el feto est ejerciendo presin sobre la vejiga.  Debido al embarazo podr sentir acidez estomacal con frecuencia.  Puede estar estreida, ya que ciertas hormonas enlentecen los movimientos de los msculos que empujan los desechos a travs de los intestinos.  Pueden aparecer hemorroides o abultarse e hincharse las venas (venas varicosas).  Puede tener dolor de espalda que se debe al aumento de peso y a que las hormonas del embarazo relajan las articulaciones entre los huesos de la pelvis, y como consecuencia de la modificacin del peso y los msculos que mantienen el equilibrio.  Las mamas seguirn creciendo y le dolern.  Las encas pueden sangrar y estar sensibles al cepillado y al hilo dental.  Pueden aparecer zonas oscuras o manchas (cloasma, mscara del embarazo) en el rostro que  probablemente se atenuarn despus del nacimiento del beb.  Es posible que se forme una lnea oscura desde el ombligo hasta la zona del pubis (linea nigra) que probablemente se atenuarn despus del nacimiento del beb.  Tal vez haya cambios en el cabello que pueden incluir su engrosamiento, crecimiento rpido y cambios en la textura. Adems, a algunas mujeres se les cae el cabello durante o despus del embarazo, o tienen el cabello seco o fino. Lo ms probable es que el cabello se le normalice despus del nacimiento del beb. QU DEBE ESPERAR EN LAS CONSULTAS PRENATALES Durante una visita prenatal de rutina:  La pesarn para asegurarse de que usted y el feto estn creciendo normalmente.  Le tomarn la presin arterial.  Le medirn el abdomen para controlar el desarrollo del beb.  Se escucharn los latidos cardacos fetales.  Se evaluarn los resultados de los estudios solicitados en visitas anteriores. El mdico puede preguntarle lo siguiente:  Cmo se siente.  Si siente los movimientos del beb.  Si ha tenido sntomas anormales, como prdida de lquido, sangrado, dolores de cabeza intensos o clicos abdominales.  Si tiene alguna pregunta. Otros estudios que podrn realizarse durante el segundo trimestre incluyen lo siguiente:  Anlisis de sangre para detectar:  Concentraciones de hierro bajas (anemia).  Diabetes gestacional (entre la semana 24 y la 28).  Anticuerpos Rh.  Anlisis de orina para detectar infecciones, diabetes o protenas en la orina.  Una ecografa para confirmar que el beb crece y se desarrolla correctamente.  Una amniocentesis para diagnosticar posibles problemas genticos.  Estudios del feto para descartar espina   bfida y sndrome de Down. INSTRUCCIONES PARA EL CUIDADO EN EL HOGAR   Evite fumar, consumir hierbas, beber alcohol y tomar frmacos que no le hayan recetado. Estas sustancias qumicas afectan la formacin y el desarrollo del beb.  Siga  las indicaciones del mdico en relacin con el uso de medicamentos. Durante el embarazo, hay medicamentos que son seguros de tomar y otros que no.  Haga actividad fsica solo en la forma indicada por el mdico. Sentir clicos uterinos es un buen signo para detener la actividad fsica.  Contine comiendo alimentos que sanos con regularidad.  Use un sostn que le brinde buen soporte si le duelen las mamas.  No se d baos de inmersin en agua caliente, baos turcos ni saunas.  Colquese el cinturn de seguridad cuando conduzca.  No coma carne cruda ni queso sin cocinar; evite el contacto con las bandejas sanitarias de los gatos y la tierra que estos animales usan. Estos elementos contienen grmenes que pueden causar defectos congnitos en el beb.  Tome las vitaminas prenatales.  Si est estreida, pruebe un laxante suave (si el mdico lo autoriza). Consuma ms alimentos ricos en fibra, como vegetales y frutas frescos y cereales integrales. Beba gran cantidad de lquido para mantener la orina de tono claro o color amarillo plido.  Dese baos de asiento con agua tibia para aliviar el dolor o las molestias causadas por las hemorroides. Use una crema para las hemorroides si el mdico la autoriza.  Si tiene venas varicosas, use medias de descanso. Eleve los pies durante 15minutos, 3 o 4veces por da. Limite la cantidad de sal en su dieta.  No levante objetos pesados, use zapatos de tacones bajos y mantenga una buena postura.  Descanse con las piernas elevadas si tiene calambres o dolor de cintura.  Visite a su dentista si an no lo ha hecho durante el embarazo. Use un cepillo de dientes blando para higienizarse los dientes y psese el hilo dental con suavidad.  Puede seguir manteniendo relaciones sexuales, a menos que el mdico le indique lo contrario.  Concurra a todas las visitas prenatales segn las indicaciones de su mdico. SOLICITE ATENCIN MDICA SI:   Tiene mareos.  Siente  clicos leves, presin en la pelvis o dolor persistente en el abdomen.  Tiene nuseas, vmitos o diarrea persistentes.  Tiene secrecin vaginal con mal olor.  Siente dolor al orinar. SOLICITE ATENCIN MDICA DE INMEDIATO SI:   Tiene fiebre.  Tiene una prdida de lquido por la vagina.  Tiene sangrado o pequeas prdidas vaginales.  Siente dolor intenso o clicos en el abdomen.  Sube o baja de peso rpidamente.  Tiene dificultad para respirar y siente dolor de pecho.  Sbitamente se le hinchan mucho el rostro, las manos, los tobillos, los pies o las piernas.  No ha sentido los movimientos del beb durante una hora.  Siente un dolor de cabeza intenso que no se alivia con medicamentos.  Hay cambios en la visin. Document Released: 08/26/2005 Document Revised: 11/21/2013 ExitCare Patient Information 2015 [employer]. This information is not intended to replace advice given to you by your health care provider. Make sure you discuss any questions you have with your health care provider.  

## 2014-06-13 ENCOUNTER — Ambulatory Visit (HOSPITAL_COMMUNITY)
Admission: RE | Admit: 2014-06-13 | Discharge: 2014-06-13 | Disposition: A | Discharge status: Home / Self Care | Payer: No Typology Code available for payment source | Source: Ambulatory Visit | Attending: Advanced Practice Midwife | Admitting: Advanced Practice Midwife

## 2014-06-13 DIAGNOSIS — Z3492 Encounter for supervision of normal pregnancy, unspecified, second trimester: Secondary | ICD-10-CM

## 2014-06-13 DIAGNOSIS — Z3689 Encounter for other specified antenatal screening: Secondary | ICD-10-CM | POA: Insufficient documentation

## 2014-06-14 ENCOUNTER — Encounter: Payer: Self-pay | Admitting: Advanced Practice Midwife

## 2014-06-27 ENCOUNTER — Ambulatory Visit (INDEPENDENT_AMBULATORY_CARE_PROVIDER_SITE_OTHER): Payer: No Typology Code available for payment source | Admitting: Advanced Practice Midwife

## 2014-06-27 VITALS — BP 92/54 | HR 76 | Wt 122.7 lb

## 2014-06-27 DIAGNOSIS — Z3483 Encounter for supervision of other normal pregnancy, third trimester: Secondary | ICD-10-CM

## 2014-06-27 DIAGNOSIS — Z3480 Encounter for supervision of other normal pregnancy, unspecified trimester: Secondary | ICD-10-CM

## 2014-06-27 LAB — POCT URINALYSIS DIP (DEVICE)
Bilirubin Urine: NEGATIVE
Glucose, UA: NEGATIVE mg/dL
Hgb urine dipstick: NEGATIVE
KETONES UR: NEGATIVE mg/dL
Leukocytes, UA: NEGATIVE
Nitrite: NEGATIVE
PH: 6.5 (ref 5.0–8.0)
Protein, ur: NEGATIVE mg/dL
SPECIFIC GRAVITY, URINE: 1.015 (ref 1.005–1.030)
Urobilinogen, UA: 0.2 mg/dL (ref 0.0–1.0)

## 2014-06-27 NOTE — Progress Notes (Signed)
Discussed TDAP, will think about it. Glucola next month. Reviewed normal US findings.

## 2014-06-27 NOTE — Patient Instructions (Signed)
Eleccin del mtodo anticonceptivo (Contraception Choices) La anticoncepcin (control de la natalidad) es el uso de cualquier mtodo o dispositivo para evitar el embarazo. A continuacin se indican algunos de esos mtodos. MTODOS HORMONALES   El Implante contraconceptivo consiste en un tubo plstico delgado que contiene la hormona progesterona. No contiene estrgenos. El mdico inserta el tubo en la parte interna del brazo. El tubo puede permanecer en el lugar durante 3 aos. Despus de los 3 aos debe retirarse. El implante impide que los ovarios liberen vulos (ovulacin), espesa el moco cervical, lo que evita que los espermatozoides ingresen al tero y hace ms delgada la membrana que cubre el interior del tero.  Inyecciones de progesterona sola: las administra el mdico cada 3 meses para evitar el embarazo. La progesterona sinttica impide que los ovarios liberen vulos. Tambin hacen que el moco cervical se espese y modifique el tejido de recubrimiento interno del tero. Esto hace ms difcil que los espermatozoides sobrevivan en el tero.  Las pldoras anticonceptivas contienen estrgenos y progesterona. Su funcin es evitar que los ovarios liberen vulos (ovulacin). Las hormonas de los anticonceptivos orales hacen que el moco cervical se haga ms espeso, lo que evita que el esperma ingrese al tero. Las pldoras anticonceptivas son recetadas por el mdico.Tambin se utilizan para tratar los perodos menstruales abundantes.  Minipldora: este tipo de pldora anticonceptiva contiene slo hormona progesterona. Deben tomarse todos los das del mes y debe recetarlas el mdico.  El parche de control de natalidad: contiene hormonas similares a las que contienen las pldoras anticonceptivas. Deben cambiarse una vez por semana y se utilizan bajo prescripcin mdica.  Anillo vaginal: contiene hormonas similares a las que contienen las pldoras anticonceptivas. Se deja colocado durante tres semanas,  se lo retira durante 1 semana y luego se coloca uno nuevo. La paciente debe sentirse cmoda al insertar y retirar el anillo de la vagina.Es necesaria la prescripcin mdica.  Anticonceptivos de emergencia: son mtodos para evitar un embarazo despus de una relacin sexual sin proteccin. Esta pldora puede tomarse inmediatamente despus de tener relaciones sexuales o hasta 5 das de haber tenido sexo sin proteccin. Es ms efectiva si se toma poco tiempo despus de la relacin sexual. Los anticonceptivos de emergencia estn disponibles sin prescripcin mdica. Consltelo con su farmacutico. No use los anticonceptivos de emergencia como nico mtodo anticonceptivo. MTODOS DE BARRERA   Condn masculino: es una vaina delgada (ltex o goma) que se coloca cubriendo al pene durante el acto sexual. Puede usarse con espermicida para aumentar la efectividad.  Condn femenino. Es una funda delicada y blanda que se adapta holgadamente a la vagina antes de las relaciones sexuales.  Diafragma: es una barrera de ltex redonda y suave que debe ser recomendado por un profesional. Se inserta en la vagina, junto con un gel espermicida. Debe insertarse antes de tener relaciones sexuales. Debe dejar el diafragma colocado en la vagina durante 6 a 8 horas despus de la relacin sexual.  Capuchn cervical: es una barrera de ltex o taza plstica redonda y suave que cubre el cuello del tero y debe ser colocada por un mdico. Puede dejarlo colocado en la vagina hasta 48 horas despus de las relaciones sexuales.  Esponja: es una pieza blanda y circular de espuma de poliuretano. Contiene un espermicida. Se inserta en la vagina despus de mojarla y antes de las relaciones sexuales.  Espermicidas: son sustancias qumicas que matan o bloquean al esperma y no lo dejan ingresar al cuello del tero y al tero. Vienen   en forma de cremas, geles, supositorios, espuma o comprimidos. No es necesario tener receta mdica. Se insertan en  la vagina con un aplicador antes de tener relaciones sexuales. El proceso debe repetirse cada vez que tiene relaciones sexuales. ANTICONCEPTIVOS INTRAUTERINOS  Dispositivo intrauterino (DIU) es un dispositivo en forma de T que se coloca en el tero durante el perodo menstrual, para evitar el embarazo. Hay dos tipos:  DIU de cobre: este tipo de DIU est recubierto con un alambre de cobre y se inserta dentro del tero. El cobre hace que el tero y las trompas de Falopio produzcan un liquido que destruye los espermatozoides. Puede permanecer colocado durante 10 aos.  DIU con hormona: este tipo de DIU contiene la hormona progestina (progesterona sinttica). La hormona espesa el moco cervical y evita que los espermatozoides ingresen al tero y tambin afina la membrana que cubre el tero para evitar la implantacin del vulo fertilizado. La hormona debilita o destruye los espermatozoides que ingresan al tero. Puede permanecer en el lugar durante 3-5 aos, segn el tipo de DIU que se utilice. MTODOS ANTICONCEPTIVOS PERMANENTES  Ligadura de trompas en la mujer: se realiza sellando, atando u obstruyendo quirrgicamente las trompas de Falopio lo que impide que el vulo descienda hacia el tero.  Esterilizacin histeroscpica: Implica la colocacin de un pequeo espiral o la insercin en cada trompa de Falopio. El mdico utiliza una tcnica llamada histeroscopa para realizar este procedimiento. El dispositivo produce la formacin de tejido cicatrizal. Esto da como resultado una obstruccin permanente de las trompas de Falopio, de modo que la esperma no pueda fertilizar el vulo. Demora alrededor de 3 meses despus del procedimiento hasta que el conducto se obstruye. Tendr que usar otro mtodo anticonceptivo durante al menos 3 meses.  Esterilizacin masculina: se realiza ligando los conductos por los que pasan los espermatozoides (vasectoma).Esto impide que el esperma ingrese a la vagina durante el acto  sexual. Luego del procedimiento, el hombre puede eyacular lquido (semen). MTODOS DE PLANIFICACIN NATURAL  Planificacin familiar natural: consiste en no tener relaciones sexuales o usar un mtodo de barrera (condn, diafragma, capuchn cervical) en los das que la mujer podra quedar embarazada.  Mtodo de calendario: consiste en el seguimiento de la duracin de cada ciclo menstrual y la identificacin de los perodos frtiles.  Mtodo de ovulacin: consiste en evitar las relaciones sexuales durante la ovulacin.  Mtodo sintotrmico: consiste en evitar las relaciones sexuales en la poca en la que se est ovulando, utilizando un termmetro y tendiendo en cuenta los sntomas de la ovulacin.  Mtodo postovulacin: consiste en planificar las relaciones sexuales para despus de haber ovulado. Independientemente del tipo o mtodo anticonceptivo que usted elija, es importante que use condones para protegerse contra las infecciones de transmisin sexual (ETS). Hable con su mdico con respecto a qu mtodo anticonceptivo es el ms apropiado para usted. Document Released: 11/16/2005 Document Revised: 07/19/2013 Exit[employer] Patient Information 2015 [employer]. This information is not intended to replace advice given to you by your health care provider. Make sure you discuss any questions you have with your health care provider.  

## 2014-07-25 ENCOUNTER — Encounter: Payer: Self-pay | Admitting: Obstetrics and Gynecology

## 2014-07-25 ENCOUNTER — Ambulatory Visit (INDEPENDENT_AMBULATORY_CARE_PROVIDER_SITE_OTHER): Payer: No Typology Code available for payment source | Admitting: Obstetrics and Gynecology

## 2014-07-25 VITALS — BP 89/42 | HR 79 | Wt 127.7 lb

## 2014-07-25 DIAGNOSIS — D689 Coagulation defect, unspecified: Secondary | ICD-10-CM

## 2014-07-25 DIAGNOSIS — O99012 Anemia complicating pregnancy, second trimester: Secondary | ICD-10-CM

## 2014-07-25 DIAGNOSIS — D696 Thrombocytopenia, unspecified: Secondary | ICD-10-CM

## 2014-07-25 DIAGNOSIS — D509 Iron deficiency anemia, unspecified: Secondary | ICD-10-CM | POA: Insufficient documentation

## 2014-07-25 DIAGNOSIS — O99019 Anemia complicating pregnancy, unspecified trimester: Secondary | ICD-10-CM

## 2014-07-25 DIAGNOSIS — O99119 Other diseases of the blood and blood-forming organs and certain disorders involving the immune mechanism complicating pregnancy, unspecified trimester: Principal | ICD-10-CM

## 2014-07-25 LAB — POCT URINALYSIS DIP (DEVICE)
Bilirubin Urine: NEGATIVE
Glucose, UA: NEGATIVE mg/dL
HGB URINE DIPSTICK: NEGATIVE
Ketones, ur: NEGATIVE mg/dL
Leukocytes, UA: NEGATIVE
Nitrite: NEGATIVE
PROTEIN: NEGATIVE mg/dL
SPECIFIC GRAVITY, URINE: 1.015 (ref 1.005–1.030)
UROBILINOGEN UA: 0.2 mg/dL (ref 0.0–1.0)
pH: 7.5 (ref 5.0–8.0)

## 2014-07-25 NOTE — Patient Instructions (Signed)
Second Trimester of Pregnancy The second trimester is from week 13 through week 28, months 4 through 6. The second trimester is often a time when you feel your best. Your body has also adjusted to being pregnant, and you begin to feel better physically. Usually, morning sickness has lessened or quit completely, you may have more energy, and you may have an increase in appetite. The second trimester is also a time when the fetus is growing rapidly. At the end of the sixth month, the fetus is about 9 inches long and weighs about 1 pounds. You will likely begin to feel the baby move (quickening) between 18 and 20 weeks of the pregnancy. BODY CHANGES Your body goes through many changes during pregnancy. The changes vary from woman to woman.   Your weight will continue to increase. You will notice your lower abdomen bulging out.  You may begin to get stretch marks on your hips, abdomen, and breasts.  You may develop headaches that can be relieved by medicines approved by your health care provider.  You may urinate more often because the fetus is pressing on your bladder.  You may develop or continue to have heartburn as a result of your pregnancy.  You may develop constipation because certain hormones are causing the muscles that push waste through your intestines to slow down.  You may develop hemorrhoids or swollen, bulging veins (varicose veins).  You may have back pain because of the weight gain and pregnancy hormones relaxing your joints between the bones in your pelvis and as a result of a shift in weight and the muscles that support your balance.  Your breasts will continue to grow and be tender.  Your gums may bleed and may be sensitive to brushing and flossing.  Dark spots or blotches (chloasma, mask of pregnancy) may develop on your face. This will likely fade after the baby is born.  A dark line from your belly button to the pubic area (linea nigra) may appear. This will likely fade  after the baby is born.  You may have changes in your hair. These can include thickening of your hair, rapid growth, and changes in texture. Some women also have hair loss during or after pregnancy, or hair that feels dry or thin. Your hair will most likely return to normal after your baby is born. WHAT TO EXPECT AT YOUR PRENATAL VISITS During a routine prenatal visit:  You will be weighed to make sure you and the fetus are growing normally.  Your blood pressure will be taken.  Your abdomen will be measured to track your baby's growth.  The fetal heartbeat will be listened to.  Any test results from the previous visit will be discussed. Your health care provider may ask you:  How you are feeling.  If you are feeling the baby move.  If you have had any abnormal symptoms, such as leaking fluid, bleeding, severe headaches, or abdominal cramping.  If you have any questions. Other tests that may be performed during your second trimester include:  Blood tests that check for:  Low iron levels (anemia).  Gestational diabetes (between 24 and 28 weeks).  Rh antibodies.  Urine tests to check for infections, diabetes, or protein in the urine.  An ultrasound to confirm the proper growth and development of the baby.  An amniocentesis to check for possible genetic problems.  Fetal screens for spina bifida and Down syndrome. HOME CARE INSTRUCTIONS   Avoid all smoking, herbs, alcohol, and unprescribed   drugs. These chemicals affect the formation and growth of the baby.  Follow your health care provider's instructions regarding medicine use. There are medicines that are either safe or unsafe to take during pregnancy.  Exercise only as directed by your health care provider. Experiencing uterine cramps is a good sign to stop exercising.  Continue to eat regular, healthy meals.  Wear a good support bra for breast tenderness.  Do not use hot tubs, steam rooms, or saunas.  Wear your  seat belt at all times when driving.  Avoid raw meat, uncooked cheese, cat litter boxes, and soil used by cats. These carry germs that can cause birth defects in the baby.  Take your prenatal vitamins.  Try taking a stool softener (if your health care provider approves) if you develop constipation. Eat more high-fiber foods, such as fresh vegetables or fruit and whole grains. Drink plenty of fluids to keep your urine clear or pale yellow.  Take warm sitz baths to soothe any pain or discomfort caused by hemorrhoids. Use hemorrhoid cream if your health care provider approves.  If you develop varicose veins, wear support hose. Elevate your feet for 15 minutes, 3-4 times a day. Limit salt in your diet.  Avoid heavy lifting, wear low heel shoes, and practice good posture.  Rest with your legs elevated if you have leg cramps or low back pain.  Visit your dentist if you have not gone yet during your pregnancy. Use a soft toothbrush to brush your teeth and be gentle when you floss.  A sexual relationship may be continued unless your health care provider directs you otherwise.  Continue to go to all your prenatal visits as directed by your health care provider. SEEK MEDICAL CARE IF:   You have dizziness.  You have mild pelvic cramps, pelvic pressure, or nagging pain in the abdominal area.  You have persistent nausea, vomiting, or diarrhea.  You have a bad smelling vaginal discharge.  You have pain with urination. SEEK IMMEDIATE MEDICAL CARE IF:   You have a fever.  You are leaking fluid from your vagina.  You have spotting or bleeding from your vagina.  You have severe abdominal cramping or pain.  You have rapid weight gain or loss.  You have shortness of breath with chest pain.  You notice sudden or extreme swelling of your face, hands, ankles, feet, or legs.  You have not felt your baby move in over an hour.  You have severe headaches that do not go away with  medicine.  You have vision changes. Document Released: 11/10/2001 Document Revised: 11/21/2013 Document Reviewed: 01/17/2013 ExitCare Patient Information 2015 [employer]. This information is not intended to replace advice given to you by your health care provider. Make sure you discuss any questions you have with your health care provider.  

## 2014-07-25 NOTE — Progress Notes (Signed)
Pt refuses glucose test due to financial issues. She has 100% but this doesn't cover lab tests. Recommended that she call [facility] to discuss payment arrangements. Also told her that gtt was a very important test for her pregnancy.

## 2014-07-25 NOTE — Progress Notes (Signed)
Language Line used.  Declines OGT today, spoke with RN re: financial arrangements> do next. Reviewed [name] result and plans.

## 2014-08-22 ENCOUNTER — Ambulatory Visit (INDEPENDENT_AMBULATORY_CARE_PROVIDER_SITE_OTHER): Payer: No Typology Code available for payment source | Admitting: Physician Assistant

## 2014-08-22 VITALS — BP 97/47 | HR 86 | Temp 97.8°F | Wt 132.2 lb

## 2014-08-22 DIAGNOSIS — Z3483 Encounter for supervision of other normal pregnancy, third trimester: Secondary | ICD-10-CM

## 2014-08-22 DIAGNOSIS — Z3480 Encounter for supervision of other normal pregnancy, unspecified trimester: Secondary | ICD-10-CM

## 2014-08-22 DIAGNOSIS — Z23 Encounter for immunization: Secondary | ICD-10-CM

## 2014-08-22 LAB — POCT URINALYSIS DIP (DEVICE)
Bilirubin Urine: NEGATIVE
Glucose, UA: NEGATIVE mg/dL
HGB URINE DIPSTICK: NEGATIVE
Ketones, ur: NEGATIVE mg/dL
Leukocytes, UA: NEGATIVE
NITRITE: NEGATIVE
PH: 7 (ref 5.0–8.0)
Protein, ur: NEGATIVE mg/dL
SPECIFIC GRAVITY, URINE: 1.01 (ref 1.005–1.030)
UROBILINOGEN UA: 0.2 mg/dL (ref 0.0–1.0)

## 2014-08-22 LAB — CBC
HEMATOCRIT: 34.6 % — AB (ref 36.0–46.0)
Hemoglobin: 11.7 g/dL — ABNORMAL LOW (ref 12.0–15.0)
MCH: 31 pg (ref 26.0–34.0)
MCHC: 33.8 g/dL (ref 30.0–36.0)
MCV: 91.8 fL (ref 78.0–100.0)
PLATELETS: 179 10*3/uL (ref 150–400)
RBC: 3.77 MIL/uL — ABNORMAL LOW (ref 3.87–5.11)
RDW: 14.3 % (ref 11.5–15.5)
WBC: 7.6 10*3/uL (ref 4.0–10.5)

## 2014-08-22 MED ORDER — TETANUS-DIPHTH-ACELL PERTUSSIS 5-2.5-18.5 LF-MCG/0.5 IM SUSP
0.5000 mL | Freq: Once | INTRAMUSCULAR | Status: AC
  Administered 2014-08-22: 0.5 mL via INTRAMUSCULAR

## 2014-08-22 NOTE — Patient Instructions (Signed)
Prueba de tolerancia a la glucosa durante el embarazo  (Glucose Tolerance Test During Pregnancy) La prueba de tolerancia a la glucosa (GTT) o la prueba de la glucosa de 3 horas se pueden utilizar para determinar si la diabetes en una mujer ha comenzado o se ha diagnosticado por primera vez durante el embarazo (diabetes gestacional ). En general, la GTT se realiza despus de tener una prueba de glucosa de 1hora con resultados que indican que posiblemente sufra diabetes gestacional.  Esta prueba dura aproximadamente 3 horas. Despus de beber la solucin de agua azucarada le harn una serie de anlisis de sangre. Deber permanecer en el lugar de la prueba para asegurarse de que se realiza a tiempo la extraccin de sangre.  INFORME A SU MDICO SOBRE:   Alergias a alimentos o medicamentos.  Medicamentos que utiliza, incluyendo vitaminas, hierbas, gotas oftlmicas, medicamentos de venta libre y cremas.  Enfermedades o infecciones recientes. ANTES DEL PROCEDIMIENTO  El GTT es una prueba de ayuno, lo que significa que debe dejar de comer durante un cierto perodo de tiempo. La prueba ser ms precisa si no come durante 8 a 12 horas antes del examen. Por esta razn, se recomienda hacer esta prueba por la maana antes de desayunar.  PROCEDIMIENTO  Al llegar al laboratorio, se toma una muestra de sangre para obtener el nivel de glucosa en sangre en ayunas. Una vez determinado el nivel de glucosa en ayunas, se le dar una solucin de azcar en agua para beber. Se le pedir que espere en un rea determinada hasta el prximo examen de sangre. Los anlisis de sangre se realizan cada hora durante 3 horas. Mantngase cerca del laboratorio para que sus muestras de sangre se puedan tomar a tiempo. Esto es importante. Si no se toman las muestras de sangre a tiempo, tendr que concurrir de nuevo en otro da para hacer la prueba.  DESPUS DEL PROCEDIMIENTO   Podr comer y beber normalmente.   Consulte la fecha en que  los resultados estarn disponibles. Asegrese de obtener los resultados. Se considera que la prueba es positiva cuando dos de los cuatro valores del anlisis de sangre son iguales o estn por arriba del nivel normal de glucosa en sangre. Document Released: 05/17/2012 Document Revised: 04/02/2014 ExitCare Patient Information 2015 [employer]. This information is not intended to replace advice given to you by your health care provider. Make sure you discuss any questions you have with your health care provider.  

## 2014-08-22 NOTE — Progress Notes (Signed)
[employer].Nada Libman.  C/o shortness of breath/ pain in chest. Denies cough ,runny nose , congestion. Oxygen saturation= 100%. C/o contractions about 5 times per day. Patient refusing 1 hour gtt because she already owes lab a lot of money.  Consented to cbg's.

## 2014-08-22 NOTE — Progress Notes (Signed)
30 weeks, c/o difficulty breathing TDAP, Flu today Discussed contraception - considering IUD Refusing GTT due to payment concerns.  Pt asked to reconsider and call if she is able to do test prior to next appt.  RTC for follow up in 2 weeks.

## 2014-08-23 LAB — HIV ANTIBODY (ROUTINE TESTING W REFLEX): HIV 1&2 Ab, 4th Generation: NONREACTIVE

## 2014-08-23 LAB — RPR

## 2014-09-05 ENCOUNTER — Ambulatory Visit (INDEPENDENT_AMBULATORY_CARE_PROVIDER_SITE_OTHER): Payer: Self-pay | Admitting: Physician Assistant

## 2014-09-05 VITALS — BP 93/59 | HR 77 | Wt 134.2 lb

## 2014-09-05 DIAGNOSIS — Z3483 Encounter for supervision of other normal pregnancy, third trimester: Secondary | ICD-10-CM

## 2014-09-05 LAB — POCT URINALYSIS DIP (DEVICE)
Bilirubin Urine: NEGATIVE
Glucose, UA: NEGATIVE mg/dL
Hgb urine dipstick: NEGATIVE
Ketones, ur: NEGATIVE mg/dL
Leukocytes, UA: NEGATIVE
NITRITE: NEGATIVE
PH: 7 (ref 5.0–8.0)
Protein, ur: NEGATIVE mg/dL
SPECIFIC GRAVITY, URINE: 1.01 (ref 1.005–1.030)
Urobilinogen, UA: 0.2 mg/dL (ref 0.0–1.0)

## 2014-09-05 NOTE — Patient Instructions (Signed)
Tercer trimestre de embarazo (Third Trimester of Pregnancy) El tercer trimestre va desde la semana29 hasta la 42, desde el sptimo hasta el noveno mes, y es la poca en la que el feto crece ms rpidamente. Hacia el final del noveno mes, el feto mide alrededor de 20pulgadas (45cm) de largo y pesa entre 6 y 10 libras (2,700 y 4,500kg).  CAMBIOS EN EL ORGANISMO Su organismo atraviesa por muchos cambios durante el embarazo, y estos varan de una mujer a otra.   Seguir aumentando de peso. Es de esperar que aumente entre 25 y 35libras (11 y 16kg) hacia el final del embarazo.  Podrn aparecer las primeras estras en las caderas, el abdomen y las mamas.  Puede tener necesidad de orinar con ms frecuencia porque el feto baja hacia la pelvis y ejerce presin sobre la vejiga.  Debido al embarazo podr sentir acidez estomacal con frecuencia.  Puede estar estreida, ya que ciertas hormonas enlentecen los movimientos de los msculos que empujan los desechos a travs de los intestinos.  Pueden aparecer hemorroides o abultarse e hincharse las venas (venas varicosas).  Puede sentir dolor plvico debido al aumento de peso y a que las hormonas del embarazo relajan las articulaciones entre los huesos de la pelvis. El dolor de espalda puede ser consecuencia de la sobrecarga de los msculos que soportan la postura.  Tal vez haya cambios en el cabello que pueden incluir su engrosamiento, crecimiento rpido y cambios en la textura. Adems, a algunas mujeres se les cae el cabello durante o despus del embarazo, o tienen el cabello seco o fino. Lo ms probable es que el cabello se le normalice despus del nacimiento del beb.  Las mamas seguirn creciendo y le dolern. A veces, puede haber una secrecin amarilla de las mamas llamada calostro.  El ombligo puede salir hacia afuera.  Puede sentir que le falta el aire debido a que se expande el tero.  Puede notar que el feto "baja" o lo siente ms bajo, en el  abdomen.  Puede tener una prdida de secrecin mucosa con sangre. Esto suele ocurrir en el trmino de unos pocos das a una semana antes de que comience el trabajo de parto.  El cuello del tero se vuelve delgado y blando (se borra) cerca de la fecha de parto. QU DEBE ESPERAR EN LOS EXMENES PRENATALES  Le harn exmenes prenatales cada 2semanas hasta la semana36. A partir de ese momento le harn exmenes semanales. Durante una visita prenatal de rutina:  La pesarn para asegurarse de que usted y el feto estn creciendo normalmente.  Le tomarn la presin arterial.  Le medirn el abdomen para controlar el desarrollo del beb.  Se escucharn los latidos cardacos fetales.  Se evaluarn los resultados de los estudios solicitados en visitas anteriores.  Le revisarn el cuello del tero cuando est prxima la fecha de parto para controlar si este se ha borrado. Alrededor de la semana36, el mdico le revisar el cuello del tero. Al mismo tiempo, realizar un anlisis de las secreciones del tejido vaginal. Este examen es para determinar si hay un tipo de bacteria, estreptococo Grupo B. El mdico le explicar esto con ms detalle. El mdico puede preguntarle lo siguiente:  Cmo le gustara que fuera el parto.  Cmo se siente.  Si siente los movimientos del beb.  Si ha tenido sntomas anormales, como prdida de lquido, sangrado, dolores de cabeza intensos o clicos abdominales.  Si tiene alguna pregunta. Otros exmenes o estudios de deteccin que pueden realizarse   durante el tercer trimestre incluyen lo siguiente:  Anlisis de sangre para controlar las concentraciones de hierro (anemia).  Controles fetales para determinar su salud, nivel de actividad y crecimiento. Si tiene alguna enfermedad o hay problemas durante el embarazo, le harn estudios. FALSO TRABAJO DE PARTO Es posible que sienta contracciones leves e irregulares que finalmente desaparecen. Se llaman contracciones de  Braxton Hicks o falso trabajo de parto. Las contracciones pueden durar horas, das o incluso semanas, antes de que el verdadero trabajo de parto se inicie. Si las contracciones ocurren a intervalos regulares, se intensifican o se hacen dolorosas, lo mejor es que la revise el mdico.  SIGNOS DE TRABAJO DE PARTO   Clicos de tipo menstrual.  Contracciones cada 5minutos o menos.  Contracciones que comienzan en la parte superior del tero y se extienden hacia abajo, a la zona inferior del abdomen y la espalda.  Sensacin de mayor presin en la pelvis o dolor de espalda.  Una secrecin de mucosidad acuosa o con sangre que sale de la vagina. Si tiene alguno de estos signos antes de la semana37 del embarazo, llame a su mdico de inmediato. Debe concurrir al hospital para que la controlen inmediatamente. INSTRUCCIONES PARA EL CUIDADO EN EL HOGAR   Evite fumar, consumir hierbas, beber alcohol y tomar frmacos que no le hayan recetado. Estas sustancias qumicas afectan la formacin y el desarrollo del beb.  Siga las indicaciones del mdico en relacin con el uso de medicamentos. Durante el embarazo, hay medicamentos que son seguros de tomar y otros que no.  Haga actividad fsica solo en la forma indicada por el mdico. Sentir clicos uterinos es un buen signo para detener la actividad fsica.  Contine comiendo alimentos que sanos con regularidad.  Use un sostn que le brinde buen soporte si le duelen las mamas.  No se d baos de inmersin en agua caliente, baos turcos ni saunas.  Colquese el cinturn de seguridad cuando conduzca.  No coma carne cruda ni queso sin cocinar; evite el contacto con las bandejas sanitarias de los gatos y la tierra que estos animales usan. Estos elementos contienen grmenes que pueden causar defectos congnitos en el beb.  Tome las vitaminas prenatales.  Si est estreida, pruebe un laxante suave (si el mdico lo autoriza). Consuma ms alimentos ricos en  fibra, como vegetales y frutas frescos y cereales integrales. Beba gran cantidad de lquido para mantener la orina de tono claro o color amarillo plido.  Dese baos de asiento con agua tibia para aliviar el dolor o las molestias causadas por las hemorroides. Use una crema para las hemorroides si el mdico la autoriza.  Si tiene venas varicosas, use medias de descanso. Eleve los pies durante 15minutos, 3 o 4veces por da. Limite la cantidad de sal en su dieta.  Evite levantar objetos pesados, use zapatos de tacones bajos y mantenga una buena postura.  Descanse con las piernas elevadas si tiene calambres o dolor de cintura.  Visite a su dentista si no lo ha hecho durante el embarazo. Use un cepillo de dientes blando para higienizarse los dientes y psese el hilo dental con suavidad.  Puede seguir manteniendo relaciones sexuales, a menos que el mdico le indique lo contrario.  No haga viajes largos excepto que sea absolutamente necesario y solo con la autorizacin del mdico.  Tome clases prenatales para entender, practicar y hacer preguntas sobre el trabajo de parto y el parto.  Haga un ensayo de la partida al hospital.  Prepare el bolso que   llevar al hospital.  Prepare la habitacin del beb.  Concurra a todas las visitas prenatales segn las indicaciones de su mdico. SOLICITE ATENCIN MDICA SI:  No est segura de que est en trabajo de parto o de que ha roto la bolsa de las aguas.  Tiene mareos.  Siente clicos leves, presin en la pelvis o dolor persistente en el abdomen.  Tiene nuseas, vmitos o diarrea persistentes.  Tiene secrecin vaginal con mal olor.  Siente dolor al orinar. SOLICITE ATENCIN MDICA DE INMEDIATO SI:   Tiene fiebre.  Tiene una prdida de lquido por la vagina.  Tiene sangrado o pequeas prdidas vaginales.  Siente dolor intenso o clicos en el abdomen.  Sube o baja de peso rpidamente.  Tiene dificultad para respirar y siente dolor de  pecho.  Sbitamente se le hinchan mucho el rostro, las manos, los tobillos, los pies o las piernas.  No ha sentido los movimientos del beb durante una hora.  Siente un dolor de cabeza intenso que no se alivia con medicamentos.  Hay cambios en la visin. Document Released: 08/26/2005 Document Revised: 11/21/2013 ExitCare Patient Information 2015 [employer]. This information is not intended to replace advice given to you by your health care provider. Make sure you discuss any questions you have with your health care provider.  

## 2014-09-05 NOTE — Progress Notes (Signed)
32 weeks, stable.  No vag bleeding, discharge, dysuria. GTT today Cont PNV RTC 2 weeks

## 2014-09-05 NOTE — Progress Notes (Signed)
Pt doing glucose test today.

## 2014-09-06 LAB — GLUCOSE TOLERANCE, 1 HOUR (50G) W/O FASTING: Glucose, 1 Hour GTT: 100 mg/dL (ref 70–140)

## 2014-09-20 ENCOUNTER — Encounter: Payer: No Typology Code available for payment source | Admitting: Family Medicine

## 2014-10-01 ENCOUNTER — Encounter: Payer: Self-pay | Admitting: Obstetrics and Gynecology

## 2014-10-16 ENCOUNTER — Ambulatory Visit (INDEPENDENT_AMBULATORY_CARE_PROVIDER_SITE_OTHER): Payer: No Typology Code available for payment source | Admitting: Advanced Practice Midwife

## 2014-10-16 VITALS — BP 96/48 | HR 83 | Wt 140.4 lb

## 2014-10-16 DIAGNOSIS — Z3483 Encounter for supervision of other normal pregnancy, third trimester: Secondary | ICD-10-CM

## 2014-10-16 LAB — OB RESULTS CONSOLE GBS: STREP GROUP B AG: NEGATIVE

## 2014-10-16 LAB — POCT URINALYSIS DIP (DEVICE)
Bilirubin Urine: NEGATIVE
Glucose, UA: NEGATIVE mg/dL
Hgb urine dipstick: NEGATIVE
Leukocytes, UA: NEGATIVE
Nitrite: NEGATIVE
Protein, ur: NEGATIVE mg/dL
Specific Gravity, Urine: 1.02 (ref 1.005–1.030)
Urobilinogen, UA: 0.2 mg/dL (ref 0.0–1.0)
pH: 7 (ref 5.0–8.0)

## 2014-10-16 NOTE — Progress Notes (Signed)
Reports intermittent contractions, denies pain.

## 2014-10-16 NOTE — Progress Notes (Signed)
Missed last two appts, no explanation. Feels well. WIll get GBS and cultures today.

## 2014-10-16 NOTE — Patient Instructions (Signed)

## 2014-10-17 LAB — GC/CHLAMYDIA PROBE AMP
CT Probe RNA: NEGATIVE
GC Probe RNA: NEGATIVE

## 2014-10-18 LAB — CULTURE, BETA STREP (GROUP B ONLY)

## 2014-10-23 ENCOUNTER — Ambulatory Visit (INDEPENDENT_AMBULATORY_CARE_PROVIDER_SITE_OTHER): Payer: Self-pay | Admitting: Obstetrics and Gynecology

## 2014-10-23 VITALS — BP 91/59 | HR 87 | Wt 141.6 lb

## 2014-10-23 DIAGNOSIS — Z3483 Encounter for supervision of other normal pregnancy, third trimester: Secondary | ICD-10-CM

## 2014-10-23 LAB — POCT URINALYSIS DIP (DEVICE)
BILIRUBIN URINE: NEGATIVE
GLUCOSE, UA: NEGATIVE mg/dL
HGB URINE DIPSTICK: NEGATIVE
KETONES UR: NEGATIVE mg/dL
Leukocytes, UA: NEGATIVE
Nitrite: NEGATIVE
Protein, ur: NEGATIVE mg/dL
Specific Gravity, Urine: 1.015 (ref 1.005–1.030)
UROBILINOGEN UA: 0.2 mg/dL (ref 0.0–1.0)
pH: 6 (ref 5.0–8.0)

## 2014-10-23 NOTE — Patient Instructions (Addendum)
Third Trimester of Pregnancy The third trimester is from week 29 through week 42, months 7 through 9. The third trimester is a time when the fetus is growing rapidly. At the end of the ninth month, the fetus is about 20 inches in length and weighs 6-10 pounds.  BODY CHANGES Your body goes through many changes during pregnancy. The changes vary from woman to woman.   Your weight will continue to increase. You can expect to gain 25-35 pounds (11-16 kg) by the end of the pregnancy.  You may begin to get stretch marks on your hips, abdomen, and breasts.  You may urinate more often because the fetus is moving lower into your pelvis and pressing on your bladder.  You may develop or continue to have heartburn as a result of your pregnancy.  You may develop constipation because certain hormones are causing the muscles that push waste through your intestines to slow down.  You may develop hemorrhoids or swollen, bulging veins (varicose veins).  You may have pelvic pain because of the weight gain and pregnancy hormones relaxing your joints between the bones in your pelvis. Backaches may result from overexertion of the muscles supporting your posture.  You may have changes in your hair. These can include thickening of your hair, rapid growth, and changes in texture. Some women also have hair loss during or after pregnancy, or hair that feels dry or thin. Your hair will most likely return to normal after your baby is born.  Your breasts will continue to grow and be tender. A yellow discharge may leak from your breasts called colostrum.  Your belly button may stick out.  You may feel short of breath because of your expanding uterus.  You may notice the fetus "dropping," or moving lower in your abdomen.  You may have a bloody mucus discharge. This usually occurs a few days to a week before labor begins.  Your cervix becomes thin and soft (effaced) near your due date. WHAT TO EXPECT AT YOUR PRENATAL  EXAMS  You will have prenatal exams every 2 weeks until week 36. Then, you will have weekly prenatal exams. During a routine prenatal visit:  You will be weighed to make sure you and the fetus are growing normally.  Your blood pressure is taken.  Your abdomen will be measured to track your baby's growth.  The fetal heartbeat will be listened to.  Any test results from the previous visit will be discussed.  You may have a cervical check near your due date to see if you have effaced. At around 36 weeks, your caregiver will check your cervix. At the same time, your caregiver will also perform a test on the secretions of the vaginal tissue. This test is to determine if a type of bacteria, Group B streptococcus, is present. Your caregiver will explain this further. Your caregiver may ask you:  What your birth plan is.  How you are feeling.  If you are feeling the baby move.  If you have had any abnormal symptoms, such as leaking fluid, bleeding, severe headaches, or abdominal cramping.  If you have any questions. Other tests or screenings that may be performed during your third trimester include:  Blood tests that check for low iron levels (anemia).  Fetal testing to check the health, activity level, and growth of the fetus. Testing is done if you have certain medical conditions or if there are problems during the pregnancy. FALSE LABOR You may feel small, irregular contractions that   eventually go away. These are called Braxton Hicks contractions, or false labor. Contractions may last for hours, days, or even weeks before true labor sets in. If contractions come at regular intervals, intensify, or become painful, it is best to be seen by your caregiver.  SIGNS OF LABOR   Menstrual-like cramps.  Contractions that are 5 minutes apart or less.  Contractions that start on the top of the uterus and spread down to the lower abdomen and back.  A sense of increased pelvic pressure or back  pain.  A watery or bloody mucus discharge that comes from the vagina. If you have any of these signs before the 37th week of pregnancy, call your caregiver right away. You need to go to the hospital to get checked immediately. HOME CARE INSTRUCTIONS   Avoid all smoking, herbs, alcohol, and unprescribed drugs. These chemicals affect the formation and growth of the baby.  Follow your caregiver's instructions regarding medicine use. There are medicines that are either safe or unsafe to take during pregnancy.  Exercise only as directed by your caregiver. Experiencing uterine cramps is a good sign to stop exercising.  Continue to eat regular, healthy meals.  Wear a good support bra for breast tenderness.  Do not use hot tubs, steam rooms, or saunas.  Wear your seat belt at all times when driving.  Avoid raw meat, uncooked cheese, cat litter boxes, and soil used by cats. These carry germs that can cause birth defects in the baby.  Take your prenatal vitamins.  Try taking a stool softener (if your caregiver approves) if you develop constipation. Eat more high-fiber foods, such as fresh vegetables or fruit and whole grains. Drink plenty of fluids to keep your urine clear or pale yellow.  Take warm sitz baths to soothe any pain or discomfort caused by hemorrhoids. Use hemorrhoid cream if your caregiver approves.  If you develop varicose veins, wear support hose. Elevate your feet for 15 minutes, 3-4 times a day. Limit salt in your diet.  Avoid heavy lifting, wear low heal shoes, and practice good posture.  Rest a lot with your legs elevated if you have leg cramps or low back pain.  Visit your dentist if you have not gone during your pregnancy. Use a soft toothbrush to brush your teeth and be gentle when you floss.  A sexual relationship may be continued unless your caregiver directs you otherwise.  Do not travel far distances unless it is absolutely necessary and only with the approval  of your caregiver.  Take prenatal classes to understand, practice, and ask questions about the labor and delivery.  Make a trial run to the hospital.  Pack your hospital bag.  Prepare the baby's nursery.  Continue to go to all your prenatal visits as directed by your caregiver. SEEK MEDICAL CARE IF:  You are unsure if you are in labor or if your water has broken.  You have dizziness.  You have mild pelvic cramps, pelvic pressure, or nagging pain in your abdominal area.  You have persistent nausea, vomiting, or diarrhea.  You have a bad smelling vaginal discharge.  You have pain with urination. SEEK IMMEDIATE MEDICAL CARE IF:   You have a fever.  You are leaking fluid from your vagina.  You have spotting or bleeding from your vagina.  You have severe abdominal cramping or pain.  You have rapid weight loss or gain.  You have shortness of breath with chest pain.  You notice sudden or extreme swelling   of your face, hands, ankles, feet, or legs.  You have not felt your baby move in over an hour.  You have severe headaches that do not go away with medicine.  You have vision changes. Document Released: 11/10/2001 Document Revised: 11/21/2013 Document Reviewed: 01/17/2013 [facility] Patient Information 2015 [place], [place]. This information is not intended to replace advice given to you by your health care provider. Make sure you discuss any questions you have with your health care provider. Eleccin del mtodo anticonceptivo (Contraception Choices) La anticoncepcin (control de la natalidad) es el uso de cualquier mtodo o dispositivo para Location managerevitar el embarazo. A continuacin se indican algunos de esos mtodos. MTODOS HORMONALES   El Implante contraconceptivo consiste en un tubo plstico delgado que contiene la hormona progesterona. No contiene estrgenos. El mdico inserta el tubo en la parte interna del brazo. El tubo puede Geneticist, molecularpermanecer en el lugar durante 3 aos. Despus de  los 3 aos debe retirarse. El implante impide que los ovarios liberen vulos (ovulacin), espesa el moco cervical, lo que evita que los espermatozoides ingresen al tero y hace ms delgada la membrana que cubre el interior del tero.  Inyecciones de progesterona sola: las Insurance underwriteradministra el mdico cada 3 meses para Location managerevitar el embarazo. La progesterona sinttica impide que los ovarios liberen vulos. Tambin hacen que el moco cervical se espese y modifique el tejido de recubrimiento interno del tero. Esto hace ms difcil que los espermatozoides sobrevivan en el tero.  Las pldoras anticonceptivas contienen estrgenos y Education officer, museumprogesterona. Su funcin es ALLTEL Corporationevitar que los ovarios liberen vulos (ovulacin). Las hormonas de los anticonceptivos orales hacen que el moco cervical se haga ms espeso, lo que evita que el esperma ingrese al tero. Las pldoras anticonceptivas son recetadas por el mdico.Tambin se utilizan para tratar los perodos menstruales abundantes.  Minipldora: este tipo de pldora anticonceptiva contiene slo hormona progesterona. Deben tomarse todos los [address]  [address]das del mes y debe recetarlas el mdico.  El parche de control de natalidad: contiene hormonas similares a las que contienen las pldoras anticonceptivas. Deben cambiarse una vez por semana y se utilizan bajo prescripcin mdica.  Anillo vaginal: contiene hormonas similares a las que contienen las pldoras anticonceptivas. Se deja colocado durante tres semanas, se lo retira durante 1 semana y luego se coloca uno nuevo. La paciente debe sentirse cmoda al insertar y retirar el anillo de la vagina.Es necesaria la prescripcin mdica.  Anticonceptivos de emergencia: son mtodos para evitar un embarazo despus de Neomia Dearuna relacin sexual sin proteccin. Esta pldora puede tomarse inmediatamente despus de Child psychotherapisttener relaciones sexuales o hasta 5 Libertydas de haber tenido sexo sin proteccin. Es ms efectiva si se toma poco tiempo despus de la relacin sexual. Los  anticonceptivos de emergencia estn disponibles sin prescripcin mdica. Consltelo con su farmacutico. No use los anticonceptivos de emergencia como nico mtodo anticonceptivo. MTODOS DE Lenis NoonBARRERA   Condn masculino: es una vaina delgada (ltex o goma) que se coloca cubriendo al pene durante el acto sexual. Deri Fuellinguede usarse con espermicida para aumentar la efectividad.  Condn femenino. Es una funda delicada y blanda que se adapta holgadamente a la vagina antes de las Clinical research associaterelaciones sexuales.  Diafragma: es una barrera de ltex redonda y suave que debe ser recomendado por un profesional. Se inserta en la vagina, junto con un gel espermicida. Debe insertarse antes de Management consultanttener relaciones sexuales. Debe dejar el diafragma colocado en la vagina durante 6 a 8 horas despus de la relacin sexual.  Capuchn cervical: es una barrera de ltex o taza plstica redonda  y Bahamassuave que cubre el cuello del tero y debe ser colocada por un mdico. Puede dejarlo colocado en la vagina hasta 48 horas despus de las relaciones sexuales.  Esponja: es una pieza blanda y circular de espuma de poliuretano. Contiene un espermicida. Se inserta en la vagina despus de mojarla y antes de las The St. Paul Travelersrelaciones sexuales.  Espermicidas: son sustancias qumicas que matan o bloquean al esperma y no lo dejan ingresar al cuello del tero y al tero. Vienen en forma de cremas, geles, supositorios, espuma o comprimidos. No es necesario tener Emergency planning/management officerreceta mdica. Se insertan en la vagina con un aplicador antes de Management consultanttener relaciones sexuales. El proceso debe repetirse cada vez que tiene relaciones sexuales. ANTICONCEPTIVOS INTRAUTERINOS  Dispositivo intrauterino (DIU) es un dispositivo en forma de T que se coloca en el tero durante el perodo menstrual, para Location managerevitar el embarazo. Hay dos tipos:  DIU de cobre: este tipo de DIU est recubierto con un alambre de cobre y se inserta dentro del tero. El cobre hace que el tero y las trompas de Falopio produzcan un  liquido que Federated Department Storesdestruye los espermatozoides. Puede permanecer colocado durante 10 aos.  DIU con hormona: este tipo de DIU contiene la hormona progestina (progesterona sinttica). La hormona espesa el moco cervical y evita que los espermatozoides ingresen al tero y tambin afina la membrana que cubre el tero para evitar la implantacin del vulo fertilizado. La hormona debilita o destruye los espermatozoides que ingresan al tero. Puede Geneticist, molecularpermanecer en el lugar durante 3-5 aos, segn el tipo de DIU que se Backusutilice. MTODOS ANTICONCEPTIVOS PERMANENTES  Ligadura de trompas en la mujer: se realiza sellando, atando u obstruyendo quirrgicamente las trompas de Falopio lo que impide que el vulo descienda hacia el tero.  Esterilizacin histeroscpica: Implica la colocacin de un pequeo espiral o la insercin en cada trompa de Falopio. El mdico utiliza una tcnica llamada histeroscopa para Primary school teacherrealizar este procedimiento. El dispositivo produce la formacin de tejido Designer, television/film setcicatrizal. Esto da como resultado una obstruccin permanente de las trompas de Falopio, de modo que la esperma no pueda fertilizar el vulo. Demora alrededor de 3 meses despus del procedimiento hasta que el conducto se obstruye. Tendr que usar otro mtodo anticonceptivo durante al menos 3 meses.  Esterilizacin masculina: se realiza ligando los conductos por los que pasan los espermatozoides (vasectoma).Esto impide que el esperma ingrese a la vagina durante el acto sexual. Luego del procedimiento, el hombre puede eyacular lquido (semen). MTODOS DE PLANIFICACIN NATURAL  Planificacin familiar natural: consiste en no Management consultanttener relaciones sexuales o usar un mtodo de barrera (condn, University Placediafragma, capuchn cervical) en los IKON Office Solutionsdas que la mujer podra quedar Marysvilleembarazada.  Mtodo de calendario: consiste en el seguimiento de la duracin de cada ciclo menstrual y la identificacin de los perodos frtiles.  Mtodo de ovulacin: Paramedicconsiste en evitar las  relaciones sexuales durante la ovulacin.  Mtodo sintotrmico: Advertising copywriterconsiste en evitar las relaciones sexuales en la poca en la que se est ovulando, utilizando un termmetro y tendiendo en cuenta los sntomas de la ovulacin.  Mtodo postovulacin: Youth workerconsiste en planificar las relaciones sexuales para despus de haber ovulado. Independientemente del tipo o mtodo anticonceptivo que usted elija, es importante que use condones para protegerse contra las infecciones de transmisin sexual (ETS). Hable con su mdico con respecto a qu mtodo anticonceptivo es el ms apropiado para usted. Document Released: 11/16/2005 Document Revised: 07/19/2013 [facility]Exit[facility] Patient Information 2015 [place]ExitCare, MarylandLLC. This information is not intended to replace advice given to you by your health care provider. Make sure  you discuss any questions you have with your health care provider.

## 2014-10-23 NOTE — Progress Notes (Signed)
Reviewed dating critera (BEGA 11 wk US) since pt thinking due date mid Dec. Wants IUD or Nexplanon but self pay so [facility] Plans to breastfeed until goes back to work.

## 2014-10-30 ENCOUNTER — Telehealth: Payer: Self-pay | Admitting: *Deleted

## 2014-10-30 ENCOUNTER — Other Ambulatory Visit: Payer: Self-pay

## 2014-10-30 NOTE — Telephone Encounter (Signed)
Called pt w/interpreter Luther RedoAdriana Schneider.  I advised her that she missed appt today @ 0900.  She stated that she had an emergency at her son's school and is just now arriving home. She agreed to appt tomorrow @ 0830.

## 2014-10-31 ENCOUNTER — Ambulatory Visit (INDEPENDENT_AMBULATORY_CARE_PROVIDER_SITE_OTHER): Payer: Self-pay | Admitting: Physician Assistant

## 2014-10-31 ENCOUNTER — Telehealth (HOSPITAL_COMMUNITY): Payer: Self-pay | Admitting: *Deleted

## 2014-10-31 VITALS — BP 114/65 | HR 90 | Wt 143.5 lb

## 2014-10-31 DIAGNOSIS — Z3483 Encounter for supervision of other normal pregnancy, third trimester: Secondary | ICD-10-CM

## 2014-10-31 DIAGNOSIS — O480 Post-term pregnancy: Secondary | ICD-10-CM

## 2014-10-31 DIAGNOSIS — Z3493 Encounter for supervision of normal pregnancy, unspecified, third trimester: Secondary | ICD-10-CM

## 2014-10-31 LAB — POCT URINALYSIS DIP (DEVICE)
Bilirubin Urine: NEGATIVE
GLUCOSE, UA: NEGATIVE mg/dL
HGB URINE DIPSTICK: NEGATIVE
KETONES UR: NEGATIVE mg/dL
Leukocytes, UA: NEGATIVE
NITRITE: NEGATIVE
PH: 7 (ref 5.0–8.0)
Protein, ur: NEGATIVE mg/dL
SPECIFIC GRAVITY, URINE: 1.015 (ref 1.005–1.030)
Urobilinogen, UA: 0.2 mg/dL (ref 0.0–1.0)

## 2014-10-31 LAB — US OB FOLLOW UP

## 2014-10-31 NOTE — Telephone Encounter (Signed)
Interpreter number [phone]

## 2014-10-31 NOTE — Progress Notes (Addendum)
Interpreter - Rebecca Schneider present for pt's visit w/nurse and provider today.  IOL scheduled 12/6 @ 0730.  Pt has decided that she wants OCP's for birth control so will schedule PP appt here instead of [facility] as previously advised per D. Poe CNM.

## 2014-10-31 NOTE — Patient Instructions (Signed)
Induccin del trabajo de parto  (Labor Induction ) Se denomina induccin del trabajo de parto cuando se inician acciones para hacer que una mujer embarazada comience el trabajo de parto. La mayora de las mujeres comienzan el trabajo de parto sin ayuda entre las semanas 37 y 42 del embarazo. Cuando esto no ocurre o cuando hay una necesidad mdica, pueden utilizarse diferentes mtodos para inducirlo. La induccin del trabajo de parto hace que el tero se contraiga. Tambin hace que el cuello del tero se ablandemadure), se abra (se dilate), y se afine (se borre). Generalmente el trabajo de parto no se induce antes de las 39 semanas excepto que haya un problema con el beb o con la madre.  Antes de inducir el trabajo de parto, el mdico considerar cierto nmero de factores incluyendo los siguientes:  El estado del beb.  Cuntas semanas tiene de embarazo.  La madurez de los pulmones del beb.  El estado del cuello del tero.  La posicin del beb. CULES SON LOS MOTIVOS PARA INDUCIR UN PARTO? El trabajo de parto puede inducirse por las siguientes razones:  La salud del beb o de la madre estn en riesgo.  El embarazo se ha pasado de trmino en 1 semana o ms.  Ha roto la bolsa de aguas pero no se ha iniciado el trabajo de parto por s mismo.  La madre tiene algn trastorno de salud o una enfermedad grave, como hipertensin arterial, una infeccin, desprendimiento abrupto de la placenta o diabetes.  Hay escaso lquido amnitico alrededor del beb.  El beb presenta sufrimiento. La conveniencia o el deseo de que el beb nazca en una cierta fecha no es un motivo para inducir el parto. CULES SON LOS MTODOS UTILIZADOS PARA INDUCIR EL TRABAJO DE PARTO? Algunos mtodos de induccin del trabajo de parto son:   Administracin del medicamentos prostaglandina. Este medicamento hace que el cuello uterino se dilate y madure. Este medicamento tambin iniciar las contracciones. Puede tomarse por  boca o insertarse en la vagina en forma de supositorio.  Insercin en la vagina de un tubo delgado (catter) con un baln en el extremo para dilatar el cuello del tero. Una vez insertado, el baln se infla con agua, lo que provoca la apertura del cuello del tero.  Ruptura de las membranas. El mdico separa el saco amnitico del cuello uterino, haciendo que el cuello uterino se distienda y cause la liberacin de la hormona llamada progesterona. Esto hace que el tero se contraiga. Este procedimiento se realiza durante una visita al consultorio mdico. Le indicarn que vuelva a su casa y espere que se inicien las contracciones. Luego tendr que volver para la induccin.  Ruptura de la bolsa de aguas. El mdico romper el saco amnitico con un pequeo instrumento. Una vez que el saco amnitico se rompe, las contracciones deben comenzar. Pueden pasar algunas horas hasta que haga efecto.  Medicamentos que desencadenen o intensifiquen las contracciones. Se lo administrarn a travs de un catter por va intravenosa (IV) que se inserta en una de las venas del brazo. Todos los mtodos de induccin, excepto la ruptura de membranas, se realizan en el hospital. La induccin se realizar en el hospital, de modo que usted y el beb puedan ser controlados cuidadosamente.  CUNTO TIEMPO LLEVA INDUCIR EL TRABAJO DE PARTO? Algunas inducciones pueden demorar entre 2 y 3 das. Generalmente lleva menos tiempo, dependiendo del estado del cuello del tero. Puede tomar ms tiempo si la induccin se realiza en etapas tempranas del embarazo   o es su primer embarazo. Si han pasado 2 o 3 das y no se inicia el trabajo de parto, podrn enviarla a su casa o realizar una cesrea. CULES SON LOS RIESGOS ASOCIADOS CON LA INDUCCiN DEL TRABAJO DE PARTO? Algunos de los riesgos de la induccin son:   Cambios en la frecuencia cardaca fetal, por ejemplo los latidos son demasiado rpidos, o lentos, o errticos.  Riesgo de distrs  fetal.  Posibilidad de infeccin en la madre o el beb.  Aumento de la posibilidad de que sea necesaria una cesrea.  Ruptura (abrupcin) de la placenta del tero (raro).  Ruptura uterina (muy raro). Cuando es necesario realizar la induccin por razones mdicas, los beneficios deben superar a los riesgos. CULES SON ALGUNAS RAZONES PARA NO INDUCIR EL TRABAJO DE PARTO? La induccin no debe realizarse si:   Se demuestra que el beb no tolera el trabajo de parto.  Fue sometida anteriormente a cirugas en el tero, como una miomectoma o le han extirpado fibromas.  La placenta est en una posicin muy baja en el tero y obstruye la abertura del cuello (placenta previa).  El beb no est ubicado con la cabeza hacia bajo.  El cordn umbilical cae hacia el canal de parto, adelante del beb. Esto puede cortar el suministro de sangre y oxgeno al beb.  Fue sometida a una cesrea anteriormente.  Hay circunstancias poco habituales, como que el beb es extremadamente prematuro. Document Released: 02/23/2008 Document Revised: 07/19/2013 ExitCare Patient Information 2015 [employer]. This information is not intended to replace advice given to you by your health care provider. Make sure you discuss any questions you have with your health care provider.  

## 2014-10-31 NOTE — Telephone Encounter (Signed)
Preadmission screen  

## 2014-10-31 NOTE — Progress Notes (Signed)
40 weeks.  No complaints.  Endorses good fetal movement.  Had slight spotting today with wiping but no blood in toilet or on underwear.  No LOF.  Does have some dysuria starting today. PNV qd U/A is negative.  Will obtain ob urine culture NST was reactive today IOL scheduled for Sunday.  Labor precautions discussed

## 2014-11-01 ENCOUNTER — Inpatient Hospital Stay (HOSPITAL_COMMUNITY)
Admission: AD | Admit: 2014-11-01 | Discharge: 2014-11-02 | DRG: 776 | Disposition: A | Discharge status: Home / Self Care | Payer: Medicaid Other | Source: Ambulatory Visit | Attending: Obstetrics & Gynecology | Admitting: Obstetrics & Gynecology

## 2014-11-01 ENCOUNTER — Encounter (HOSPITAL_COMMUNITY): Payer: Self-pay | Admitting: General Practice

## 2014-11-01 DIAGNOSIS — O9943 Diseases of the circulatory system complicating the puerperium: Principal | ICD-10-CM | POA: Diagnosis not present

## 2014-11-01 DIAGNOSIS — IMO0001 Reserved for inherently not codable concepts without codable children: Secondary | ICD-10-CM

## 2014-11-01 DIAGNOSIS — I959 Hypotension, unspecified: Secondary | ICD-10-CM | POA: Diagnosis not present

## 2014-11-01 DIAGNOSIS — Z3A40 40 weeks gestation of pregnancy: Secondary | ICD-10-CM | POA: Diagnosis present

## 2014-11-01 MED ORDER — BENZOCAINE-MENTHOL 20-0.5 % EX AERO
1.0000 "application " | INHALATION_SPRAY | CUTANEOUS | Status: DC | PRN
  Administered 2014-11-01: 1 via TOPICAL
  Filled 2014-11-01: qty 56

## 2014-11-01 MED ORDER — DIBUCAINE 1 % RE OINT: 1.0000 "application " | TOPICAL_OINTMENT | RECTAL | Status: DC | PRN

## 2014-11-01 MED ORDER — SIMETHICONE 80 MG PO CHEW: 80.0000 mg | CHEWABLE_TABLET | ORAL | Status: DC | PRN

## 2014-11-01 MED ORDER — OXYTOCIN 10 UNIT/ML IJ SOLN
10.0000 [IU] | Freq: Once | INTRAMUSCULAR | Status: AC
  Administered 2014-11-01: 10 [IU] via INTRAMUSCULAR
  Filled 2014-11-01: qty 1

## 2014-11-01 MED ORDER — ONDANSETRON HCL 4 MG PO TABS: 4.0000 mg | ORAL_TABLET | ORAL | Status: DC | PRN

## 2014-11-01 MED ORDER — ONDANSETRON HCL 4 MG/2ML IJ SOLN: 4.0000 mg | INTRAMUSCULAR | Status: DC | PRN

## 2014-11-01 MED ORDER — WITCH HAZEL-GLYCERIN EX PADS: 1.0000 "application " | MEDICATED_PAD | CUTANEOUS | Status: DC | PRN

## 2014-11-01 MED ORDER — SODIUM CHLORIDE 0.9 % IJ SOLN: 3.0000 mL | INTRAMUSCULAR | Status: DC | PRN

## 2014-11-01 MED ORDER — OXYTOCIN 40 UNITS IN LACTATED RINGERS INFUSION - SIMPLE MED: 62.5000 mL/h | INTRAVENOUS | Status: DC | PRN

## 2014-11-01 MED ORDER — PRENATAL MULTIVITAMIN CH
1.0000 | ORAL_TABLET | Freq: Every day | ORAL | Status: DC
  Administered 2014-11-02: 1 via ORAL
  Filled 2014-11-01: qty 1

## 2014-11-01 MED ORDER — LANOLIN HYDROUS EX OINT: TOPICAL_OINTMENT | CUTANEOUS | Status: DC | PRN

## 2014-11-01 MED ORDER — BISACODYL 10 MG RE SUPP: 10.0000 mg | Freq: Every day | RECTAL | Status: DC | PRN

## 2014-11-01 MED ORDER — SENNOSIDES-DOCUSATE SODIUM 8.6-50 MG PO TABS
2.0000 | ORAL_TABLET | ORAL | Status: DC
  Filled 2014-11-01: qty 2

## 2014-11-01 MED ORDER — FLEET ENEMA 7-19 GM/118ML RE ENEM: 1.0000 | ENEMA | Freq: Every day | RECTAL | Status: DC | PRN

## 2014-11-01 MED ORDER — IBUPROFEN 600 MG PO TABS
600.0000 mg | ORAL_TABLET | Freq: Four times a day (QID) | ORAL | Status: DC
  Administered 2014-11-01 – 2014-11-02 (×3): 600 mg via ORAL
  Filled 2014-11-01 (×4): qty 1

## 2014-11-01 MED ORDER — SODIUM CHLORIDE 0.9 % IV SOLN: 250.0000 mL | INTRAVENOUS | Status: DC | PRN

## 2014-11-01 MED ORDER — ZOLPIDEM TARTRATE 5 MG PO TABS: 5.0000 mg | ORAL_TABLET | Freq: Every evening | ORAL | Status: DC | PRN

## 2014-11-01 MED ORDER — DIPHENHYDRAMINE HCL 25 MG PO CAPS: 25.0000 mg | ORAL_CAPSULE | Freq: Four times a day (QID) | ORAL | Status: DC | PRN

## 2014-11-01 MED ORDER — SODIUM CHLORIDE 0.9 % IJ SOLN: 3.0000 mL | Freq: Two times a day (BID) | INTRAMUSCULAR | Status: DC

## 2014-11-01 NOTE — MAU Note (Signed)
Peri care completed. Ice pack to perineum

## 2014-11-01 NOTE — H&P (Signed)
LABOR ADMISSION HISTORY AND PHYSICAL  Rebecca Schneider is a 28 y.o. female G2P1001 with IUP at 1970w4d by 11wd redating sono not consistent with LMP presenting for after precipitous delivery in car. Wants to breast feed, OCPs and condoms for contraception.  Dating: By redating 2815w2d sono--->  Estimated Date of Delivery: 10/28/14   Prenatal History/Complications:  Past Medical History: History reviewed. No pertinent past medical history.  Past Surgical History: History reviewed. No pertinent past surgical history.  Obstetrical History: OB History    Gravida Para Term Preterm AB TAB SAB Ectopic Multiple Living   2 1 1       1       Social History: History   Social History  . Marital Status: Married    Spouse Name: N/A    Number of Children: N/A  . Years of Education: N/A   Social History Main Topics  . Smoking status: Never Smoker   . Smokeless tobacco: None  . Alcohol Use: No  . Drug Use: No  . Sexual Activity: Yes    Birth Control/ Protection: Rhythm   Other Topics Concern  . None   Social History Narrative    Family History: Family History  Problem Relation Age of Onset  . Cancer Brother     neck  . Diabetes Maternal Grandfather     Allergies: No Known Allergies  Prescriptions prior to admission  Medication Sig Dispense Refill Last Dose  . Prenatal Vit-Fe Fumarate-FA (PRENATAL VITAMINS) 28-0.8 MG TABS 1 po q d 100 tablet 0 Taking     Review of Systems   All systems reviewed and negative except as stated in HPI  Last menstrual period 01/06/2014. General appearance: alert and cooperative Lungs: clear to auscultation bilaterally Heart: regular rate and rhythm Abdomen: soft, non-tender; bowel sounds normal Pelvic: cord sitting in vagina Extremities: Homans sign is negative, no sign of DVT DTR's not examined      Prenatal labs: ABO, Rh: A/POS/-- (05/06 1124) Antibody: NEG (05/06 1124) Rubella:   RPR: NON REAC (09/23 1328)  HBsAg: NEGATIVE  (05/06 1124)  HIV: NONREACTIVE (09/23 1328)  GBS: Negative (11/17 0000)  1 hr Glucola 100 Genetic screening  declined Anatomy US normal   [facility]  Dating 11w sono  Genetic Screen declined  Anatomic US  Normal  GTT Third trimester: 100  TDaP vaccine  08/22/14  Flu vaccine 08/22/14  GBS  Neg  Contraception  undecided ?IUD or Nexplanon  Baby Food  breast  Circumcision  No  Pediatrician undecided  Support Person Family      No results found for this or any previous visit (from the past 24 hour(s)).  Patient Active Problem List   Diagnosis Date Noted  . Status post vaginal delivery 05/02/2014    Assessment: Rebecca Schneider is a 28 y.o. G2P1001 at 5370w4d here after precipitous delivery prior to arrival  Breast feeding OCPs and condoms for contraception.    Rebecca Schneider 11/01/2014, 7:02 PM

## 2014-11-01 NOTE — Plan of Care (Signed)
Problem: Discharge Progression Outcomes Goal: MMR given as ordered Outcome: Not Applicable Date Met:  62/83/66

## 2014-11-01 NOTE — Consult Note (Signed)
[facility] 11/01/2014  6:23 PM  Delivery Note:  Vaginal Birth          Rebecca Schneider        MRN:  [id number]  I was called to MAU at request of the patient's obstetrician (Dr. Macon LargeAnyanwu) due to birth of a term baby at home.  PRENATAL HX:   [facility].  Was seen yesterday with no complaints.  U/A was negative, and NST reactive.  Induction of labor planned for this coming Sunday (12/6).  H/O UTI in May.  GBS negative on 10/16/14.  Unable to find any other prenatal labs by my review of mom's Epic record.  INTRAPARTUM HX:   SVD at home.  EMS arrived about 2 minutes following the birth.    DELIVERY:   SVD.  Vigorous female who appeared to be term gestation.  EMS staff dried baby and wrapped her in blanket.  [facility], baby's temperature was 35.6 degrees.  PE revealed baby c/w term gestation, vigorous, responsive.  HR 150 with normal RR.  AF flat and soft.  Palate intact.  Lungs clear.  Cor RRR without murmur appreciated.  Bowel sounds appreciated.  Soft, non-distended abdomen.  Female genitalia.  No hip click felt.  Sacral area looks normal.  Equal movement of all 4 extremities.  Tone appropriate.  Baby was left with central nursery nurse, who will assist parent with skin-to-skin care. ____________________ Electronically Signed By: Angelita InglesMcCrae S. Annetta Deiss, MD Neonatologist

## 2014-11-02 LAB — CULTURE, OB URINE: Colony Count: 50000

## 2014-11-02 MED ORDER — NORETHINDRONE 0.35 MG PO TABS: ORAL_TABLET | ORAL | Status: DC

## 2014-11-02 MED ORDER — IBUPROFEN 600 MG PO TABS: 600.0000 mg | ORAL_TABLET | Freq: Four times a day (QID) | ORAL | Status: DC | PRN

## 2014-11-02 NOTE — Plan of Care (Signed)
Problem: Discharge Progression Outcomes Goal: Barriers To Progression Addressed/Resolved Outcome: Completed/Met Date Met:  11/02/14 Goal: Activity appropriate for discharge plan Outcome: Completed/Met Date Met:  11/02/14 Goal: Tolerating diet Outcome: Completed/Met Date Met:  32/20/25 Goal: Complications resolved/controlled Outcome: Completed/Met Date Met:  11/02/14 Goal: Pain controlled with appropriate interventions Outcome: Completed/Met Date Met:  11/02/14 Goal: Afebrile, VS remain stable at discharge Outcome: Completed/Met Date Met:  11/02/14 Goal: Discharge plan in place and appropriate Outcome: Completed/Met Date Met:  11/02/14 Goal: Other Discharge Outcomes/Goals Outcome: Completed/Met Date Met:  11/02/14

## 2014-11-02 NOTE — Progress Notes (Addendum)
During my shift I Stopped twice to patients room to check on her needs, by Orlan LeavensViria Alvarez, Interpreter

## 2014-11-02 NOTE — Progress Notes (Signed)
Post Partum Day 1 Subjective: voiding and states she feels dizzy when she stands up. States she is in no pain.   Objective: Blood pressure 89/50, pulse 72, temperature 97.8 F (36.6 C), temperature source Oral, resp. rate 18, last menstrual period 01/06/2014.  Physical Exam:  General: alert, cooperative and no distress Lochia: appropriate Uterine Fundus: firm Incision: healing well DVT Evaluation: No evidence of DVT seen on physical exam. Negative Homan's sign.  No results for input(s): HGB, HCT in the last 72 hours.  Assessment/Plan: Plan for discharge tomorrow, Breastfeeding and Contraception OCP  Order orthostatics on patient due to patient feeling dizzy and last BP was 89/50   LOS: 1 day   Karma LewFong, Chelsea K 11/02/2014, 8:04 AM   I have seen and examined this patient.  Will hold off on orthostatics as pt states she is ambulating without difficulty now. See D/C summary- is requesting to be discharged this evening after 24hrs post delivery if baby is cleared to go. Cam HaiSHAW, Adonys Wildes CNM 5:37 PM 11/02/2014

## 2014-11-02 NOTE — Plan of Care (Signed)
Problem: Phase II Progression Outcomes Goal: Pain controlled on oral analgesia Outcome: Completed/Met Date Met:  11/02/14 Goal: Progress activity as tolerated unless otherwise ordered Outcome: Completed/Met Date Met:  11/02/14 Goal: Afebrile, VS remain stable Outcome: Completed/Met Date Met:  11/02/14 Goal: Tolerating diet Outcome: Completed/Met Date Met:  11/02/14 Goal: Other Phase II Outcomes/Goals Outcome: Completed/Met Date Met:  11/02/14

## 2014-11-02 NOTE — Discharge Summary (Signed)
Obstetric Discharge Summary Reason for Admission: onset of labor with precipitous delivery prior to arrival Prenatal Procedures: none Intrapartum Procedures: spontaneous vaginal delivery Postpartum Procedures: none Complications-Operative and Postpartum: none HEMOGLOBIN  Date Value Ref Range Status  08/22/2014 11.7* 12.0 - 15.0 g/dL Final   HCT  Date Value Ref Range Status  08/22/2014 34.6* 36.0 - 46.0 % Final   [name] is a 28yo G2P1001 @ 40.5wks by 11wk scan who delivered precipitously and was brought in by EMS immediately afterwards. [facility] and was essentially unremarkable. By PPD#1 she was doing well and desired d/c at 24hrs post delivery. Breastfeeding going well with desire for POPs for contraception- instructions for taking rev'd with pt.  Physical Exam:  General: alert, cooperative and no distress  Heart: RRR Lungs: nl effort Lochia: appropriate Uterine Fundus: firm DVT Evaluation: No evidence of DVT seen on physical exam.  Discharge Diagnoses: Term Pregnancy-delivered  Discharge Information: Date: 11/02/2014 Activity: pelvic rest Diet: routine Medications: PNV, Ibuprofen and Micronor to start 11/25/14 Condition: stable Instructions: refer to practice specific booklet Discharge to: home Follow-up Information    [facility] Schedule an appointment as soon as possible for a visit in 6 weeks.   Why:  For your postpartum appointment.   Contact information:   [address] [place] [phone]      Newborn Data: Live born female  Birth Weight: 6 lb 12.8 oz (3085 g) APGAR: 9, 9  Home with mother.  Cam HaiSHAW, Ensley Blas CNM 11/02/2014, 9:18 AM

## 2014-11-02 NOTE — Lactation Note (Signed)
This note was copied from the chart of Rebecca Schneider. Lactation Consultation Note With Spanish interpreter asked mom about BF and watched a latch. Experienced BF for 1 year of her first child. Feels likes she is prepared for BF. Has good everted nipples. Encouraged to massage breast as eating. Mom encouraged to feed baby 8-12 times/24 hours and with feeding cues.  Educated about newborn behavior. Referred to Baby and Me Book in Breastfeeding section Pg. 22-23 for position options and Proper latch demonstration.Encouraged to call for assistance if needed and to verify proper latch.Hand expression taught to Mom. Mom encouraged to do skin-to-skin.Mom encouraged to waken baby for feeds. Encouraged comfort during BF so colostrum flows better and mom will enjoy the feeding longer. Taking deep breaths and breast massage during BF. Hand expression taught, happy to see colostrum. WH/LC brochure given w/resources, support groups and LC services. Patient Name: Rebecca Schneider Today's Date: 11/02/2014 Reason for consult: Initial assessment   Maternal Data Has patient been taught Hand Expression?: Yes Does the patient have breastfeeding experience prior to this delivery?: Yes  Feeding Feeding Type: Breast Fed Length of feed: 20 min  LATCH Score/Interventions Latch: Grasps breast easily, tongue down, lips flanged, rhythmical sucking.  Audible Swallowing: A few with stimulation Intervention(s): Skin to skin;Hand expression Intervention(s): Alternate breast massage  Type of Nipple: Everted at rest and after stimulation  Comfort (Breast/Nipple): Soft / non-tender     Hold (Positioning): Assistance needed to correctly position infant at breast and maintain latch. Intervention(s): Breastfeeding basics reviewed;Support Pillows;Position options;Skin to skin (with interpreter)  LATCH Score: 8  Lactation Tools Discussed/Used     Consult Status Consult Status: Follow-up Date:  11/03/14 Follow-up type: In-patient    Charyl DancerCARVER, Savian Mazon G 11/02/2014, 5:48 AM

## 2014-11-02 NOTE — Discharge Instructions (Signed)

## 2014-11-02 NOTE — Plan of Care (Signed)
Problem: Consults Goal: Postpartum Patient Education (See Patient Education module for education specifics.)  Outcome: Completed/Met Date Met:  11/02/14  Problem: Phase I Progression Outcomes Goal: Pain controlled with appropriate interventions Outcome: Completed/Met Date Met:  11/02/14 Goal: Voiding adequately Outcome: Completed/Met Date Met:  11/02/14 Goal: OOB as tolerated unless otherwise ordered Outcome: Completed/Met Date Met:  11/02/14 Goal: VS, stable, temp < 100.4 degrees F Outcome: Completed/Met Date Met:  11/02/14 Goal: Initial discharge plan identified Outcome: Completed/Met Date Met:  11/02/14 Goal: Other Phase I Outcomes/Goals Outcome: Completed/Met Date Met:  11/02/14

## 2014-11-04 ENCOUNTER — Inpatient Hospital Stay (HOSPITAL_COMMUNITY): Admission: RE | Admit: 2014-11-04 | Payer: Self-pay | Source: Ambulatory Visit

## 2014-11-05 NOTE — Progress Notes (Signed)
Post discharge ur review completed. 

## 2014-11-09 ENCOUNTER — Encounter (HOSPITAL_COMMUNITY): Payer: Self-pay | Admitting: *Deleted

## 2014-12-12 ENCOUNTER — Ambulatory Visit (INDEPENDENT_AMBULATORY_CARE_PROVIDER_SITE_OTHER): Payer: Self-pay | Admitting: Obstetrics & Gynecology

## 2014-12-12 ENCOUNTER — Encounter: Payer: Self-pay | Admitting: Obstetrics & Gynecology

## 2014-12-12 NOTE — Progress Notes (Signed)
Patient ID: Rebecca Schneider, female   DOB: 1986/04/14, [age] y.o.   MRN: [id number] Interpreter - Delorise RoyalsJulie Sowell present for encounter today. Pt expresses many concerns today. Her husband is out of the country and she is alone to care for the new baby and her son. She is having behavioral problems with her older child. She also feels that she does not have enough breast milk for the baby and the baby wants to nurse all the time.  She has been crying a lot bur denies SI or feelings of wanting to hurt herself. [facility] and encouraged to call for consultation.  She was also given the number for Lactation Consultant to assist with questions and issues regarding breastfeeding.

## 2015-01-31 ENCOUNTER — Encounter: Payer: Self-pay | Admitting: Obstetrics & Gynecology

## 2016-04-29 ENCOUNTER — Emergency Department (HOSPITAL_COMMUNITY)
Admission: EM | Admit: 2016-04-29 | Discharge: 2016-04-29 | Disposition: A | Discharge status: Home / Self Care | Payer: Self-pay | Attending: Emergency Medicine | Admitting: Emergency Medicine

## 2016-04-29 ENCOUNTER — Encounter (HOSPITAL_COMMUNITY): Payer: Self-pay | Admitting: Emergency Medicine

## 2016-04-29 DIAGNOSIS — N644 Mastodynia: Secondary | ICD-10-CM | POA: Insufficient documentation

## 2016-04-29 NOTE — ED Provider Notes (Signed)
CSN: [id number]     Arrival date & time 04/29/16  1641 History  By signing my name below, I, [name], attest that this documentation has been prepared under the direction and in the presence of [employer] MillsBenjamin Ayshia Gramlich, PA-C. Electronically Signed: Randell PatientMarrissa Schneider, ED Scribe. 04/29/2016. 6:38 PM.    Chief Complaint  Patient presents with  . Breast Pain    The history is provided by the patient. A language interpreter was used.  HPI Comments: Rebecca Schneider is a 30 y.o. female who presents to the Emergency Department complaining of constant, unchanging left breast pain to the top of her left breast over small bump onset 2 month ago. Pt states that she had a small bump that she describes as a pimple appear on her left breast 2 months ago that she had I&Ded by a private physician 1 month ago and with continuing for the past month. She notes a similar bump directly on the left breast directly below the original bump that resolved without treatment. Denies following with this provider since this initial visit because he did not perform any labs or imaging on her. Denies pain in her right breast or any other symptoms currently.No fevers, chills, or drainage, chest pain, source of breath.   Past Medical History  Diagnosis Date  . Hypotension     began after delivery of baby 11/01/14   History reviewed. No pertinent past surgical history. Family History  Problem Relation Age of Onset  . Cancer Brother     neck  . Diabetes Maternal Grandfather    Social History  Substance Use Topics  . Smoking status: Never Smoker   . Smokeless tobacco: None  . Alcohol Use: No   OB History    Gravida Para Term Preterm AB TAB SAB Ectopic Multiple Living   2 2 2       0 2     Review of Systems  Skin: Positive for wound.       Positive for left breast pain  All other systems reviewed and are negative.     Allergies  Review of patient's allergies indicates no known allergies.  Home  Medications   Prior to Admission medications   Medication Sig Start Date End Date Taking? Authorizing Provider  ibuprofen (ADVIL,MOTRIN) 600 MG tablet Take 1 tablet (600 mg total) by mouth every 6 (six) hours as needed. 11/02/14   Arabella MerlesKimberly D Shaw, CNM  norethindrone (ORTHO MICRONOR) 0.35 MG tablet Starting on 11/25/14 take one pill by mouth everyday Patient not taking: Reported on 12/12/2014 11/02/14   Arabella MerlesKimberly D Shaw, CNM  Prenatal Vit-Fe Fumarate-FA (PRENATAL VITAMINS) 28-0.8 MG TABS 1 po q d Patient taking differently: Take 1 tablet by mouth daily.  03/07/14   Hayden Rasmussenavid Mabe, NP   BP 106/74 mmHg  Pulse 77  Temp(Src) 97.6 F (36.4 C) (Oral)  Resp 18  SpO2 100% Physical Exam  Constitutional: She is oriented to person, place, and time. She appears well-developed and well-nourished. No distress.  HENT:  Head: Normocephalic and atraumatic.  Eyes: Conjunctivae are normal.  Neck: Normal range of motion.  Cardiovascular: Normal rate.   Pulmonary/Chest: Effort normal. No respiratory distress. Left breast exhibits tenderness.  Female chaperone present for entirety of exam.  Very small surgical linear scar on left breast from previous I&D. Healing well without erythema or fluctuance. Very mild tenderness.  Musculoskeletal: Normal range of motion.  Neurological: She is alert and oriented to person, place, and time.  Skin: Skin is warm and dry.  Psychiatric: She has a normal mood and affect. Her behavior is normal.  Nursing note and vitals reviewed.   ED Course  Procedures   DIAGNOSTIC STUDIES: Oxygen Saturation is 100% on RA, normal by my interpretation.    COORDINATION OF CARE: 6:22 PM Will provide pt with referral to the [facility]. Advised pt to follow-up with the [facility] for further evaluation. Discussed treatment plan with pt at bedside and pt agreed to plan.    MDM  Patient here for left-sided breast tenderness after I and D in March. On exam, there is no evidence of  infection. No mastitis. Vital signs are stable, afebrile. Patient given referral to women's center for further evaluation. She verbalizes understanding and agrees with this plan and subsequent discharge. Voices no other questions or concerns. Final diagnoses:  Breast pain in female    I personally performed the services described in this documentation, which was scribed in my presence. The recorded information has been reviewed and is accurate.    Joycie Peek, PA-C 04/29/16 1844  Jacalyn Lefevre, MD 04/29/16 2141

## 2016-04-29 NOTE — Discharge Instructions (Signed)
There does not appear to be an emergent cause for your symptoms at this time. It is important to follow-up with the women's center for definitive care. Return to ED for any new or worsening symptoms.  Sensibilidad en las mamas (Breast Tenderness) La sensibilidad en las mamas es un problema frecuente en las mujeres de todas las edades. y puede causar molestias leves o dolor intenso. Sus causas son variadas. Su mdico determinar la causa probable de la sensibilidad Education administratormediante el examen de las [place], las preguntas United Stationerssobre los sntomas y la indicacin de algunos estudios. Por lo general, la sensibilidad en las mamas no significa que tenga cncer de mama. INSTRUCCIONES PARA EL CUIDADO EN EL HOGAR  A menudo, la sensibilidad en las mamas puede tratarse en Advice workerel hogar. Puede intentar lo siguiente:  Probarse un nuevo sostn que le brinde ms sujecin, especialmente mientras hace actividad fsica.  Usar un sostn con mejor sujecin o uno deportivo mientras duerme cuando las mamas estn muy sensibles.  Si tiene una lesin mamaria, aplique hielo en la zona:  Ponga el hielo en una bolsa plstica.  Colquese una toalla entre la piel y la bolsa de hielo.  Deje el hielo durante 20 minutos y aplquelo 2 a 3 veces por da.  Si tiene las Energy East Corporationmamas repletas de [place] debido a la Market researcherlactancia, intente lo siguiente:  Extrigase United Stationersleche manualmente o con un sacaleche.  Aplquese una compresa tibia en las mamas para ayudar a Manufacturing systems engineerla descarga.  Tome analgsicos de venta libre si su mdico lo autoriza.  Tome otros medicamentos que su mdico le recete, entre ellos, antibiticos o anticonceptivos. A largo plazo, puede aliviar la sensibilidad en las mamas si hace lo siguiente:  Disminuye el consumo de cafena.  Disminuye la cantidad de grasa de la dieta. Lleva un registro de [address] y las horas cuando tiene mayor sensibilidad en las Medical [place]. Esto ser de ayuda para que usted y su mdico encuentren la causa de la sensibilidad y cmo  Runner, broadcasting/film/videoaliviarla. Adems, aprenda cmo examinarse las mamas en casa. Esto la ayudar a palpar un crecimiento o un bulto fuera de lo normal que podra causar la sensibilidad. SOLICITE ATENCIN MDICA SI:   Cualquier zona de la mama est dura, enrojecida y caliente al tacto. Puede ser un signo de infeccin.  Hay secrecin de los pezones (y no est amamantando). En especial, vigile la secrecin de sangre o pus.  Tiene fiebre, adems de sensibilidad en las mamas.  Tiene un bulto nuevo o doloroso en la mama que no desaparece despus de la finalizacin del perodo menstrual.  Ha intentando controlar el dolor en casa, pero no desaparece.  El dolor de la mama es ms intenso o le dificulta hacer las cosas que hace habitualmente durante el da.   Esta informacin no tiene Theme park managercomo fin reemplazar el consejo del mdico. Asegrese de hacerle al mdico cualquier pregunta que tenga.   Document Released: 09/06/2013 Elsevier Interactive Patient Education 2016 Ar[employer]vier Inc.  Wolf PointMamografa (Mammogram) Thereasa SoloUna mamografa es un radiografa de las mamas que se realiza para determinar si hay cambios que no son normales. Este estudio permite explorar y Clinical research associateencontrar cualquier cambio que pudiera sugerir la presencia de cncer de mama. Tambin puede ayudar a identificar otros cambios y variaciones en las mamas. ANTES DEL PROCEDIMIENTO  Hgase este estudio aproximadamente 1 o 2 semanas despus de la menstruacin. Generalmente, este es el momento en que las mamas estn menos sensibles.  Si consulta a un mdico nuevo o cambia de [place], enve las Ball Corporationmamografas  anteriores al [employer].  El da del estudio, [address] las [place] y [place].  No use desodorantes, perfumes, lociones o talcos el da del estudio.  Qutese las alhajas del cuello.  Use prendas que pueda ponerse y sacarse fcilmente. PROCEDIMIENTO  Se quitar la ropa de la cintura para [place]. Se colocar una bata.  Debe permanecer de pie delante de la mquina de  rayos-X.  Se colocar cada mama entre dos placas de vidrio o de plstico. Las placas comprimirn las mamas durante unos segundos. Intente estar lo ms relajada posible. Esto no causa ningn dao a las mamas. Si siente alguna molestia, ser pasajera.  Se tomarn radiografas desde diferentes ngulos de cada mama. Este procedimiento puede variar segn el mdico y el hospital. DESPUS DEL PROCEDIMIENTO  La mamografa ser examinada por un especialista (radilogo).  Tal vez deba repetir [employer] del estudio. Esto depende de la calidad de las imgenes.  Pregunte la fecha en que los resultados estarn disponibles. Asegrese de [employer].  Puede retomar sus actividades habituales.   Esta informacin no tiene Theme park manager el consejo del mdico. Asegrese de hacerle al mdico cualquier pregunta que tenga.   Document Released: 12/19/2010 Document Revised: 11/05/2011 Elsevier Interactive Patient Education [employer]

## 2016-04-29 NOTE — ED Notes (Signed)
Pt sts small abscess on left breast that was lanced but now still having pain

## 2016-04-29 NOTE — ED Notes (Signed)
See Pa assessment for secondary assessment.

## 2016-05-04 ENCOUNTER — Other Ambulatory Visit (HOSPITAL_COMMUNITY): Payer: Self-pay | Admitting: *Deleted

## 2016-05-04 DIAGNOSIS — N644 Mastodynia: Secondary | ICD-10-CM

## 2016-05-21 ENCOUNTER — Ambulatory Visit (HOSPITAL_COMMUNITY)
Admission: RE | Admit: 2016-05-21 | Discharge: 2016-05-21 | Disposition: A | Discharge status: Home / Self Care | Payer: No Typology Code available for payment source | Source: Ambulatory Visit | Attending: Obstetrics and Gynecology | Admitting: Obstetrics and Gynecology

## 2016-05-21 ENCOUNTER — Ambulatory Visit
Admission: RE | Admit: 2016-05-21 | Discharge: 2016-05-21 | Disposition: A | Discharge status: Home / Self Care | Payer: No Typology Code available for payment source | Source: Ambulatory Visit | Attending: Obstetrics and Gynecology | Admitting: Obstetrics and Gynecology

## 2016-05-21 ENCOUNTER — Encounter (HOSPITAL_COMMUNITY): Payer: Self-pay

## 2016-05-21 VITALS — BP 102/60 | Temp 98.1°F | Ht 62.0 in | Wt 130.8 lb

## 2016-05-21 DIAGNOSIS — Z1239 Encounter for other screening for malignant neoplasm of breast: Secondary | ICD-10-CM

## 2016-05-21 DIAGNOSIS — N644 Mastodynia: Secondary | ICD-10-CM

## 2016-05-21 NOTE — Progress Notes (Signed)
Complaints of left breast pain since March 2017 after had an I&D completed in her PCP's office. Patient states the pain comes and goes. Patient stated she quit breast feeding in November 2016.  Pap Smear:  Pap smear not completed today. [facility] and normal. Per patient has no history of an abnormal Pap smear. Last Pap smear result is in EPIC.  Physical exam: Breasts Breasts symmetrical. No skin abnormalities right breast. Scar observed left breast at 1 o'clock from I&D procedure. No nipple retraction bilateral breasts. No nipple discharge bilateral breasts. No lymphadenopathy. No lumps palpated bilateral breasts. Complaints of left breast tenderness between 12 o'clock and 3 o'clock on exam. [facility]. Appointment scheduled for Thursday, May 21, 2016 at 1510.       Pelvic/Bimanual No Pap smear completed today since last Pap smear was 04/04/2014. Pap smear not indicated per BCCCP guidelines.   Smoking History: Patient has never smoked.  Patient Navigation: Patient education provided. [facility]. Spanish interpreter provided.  Used Spanish interpreter Leana RoeMarley Adams from [employer].

## 2016-05-21 NOTE — Patient Instructions (Signed)
Educational materials on self breast awareness given. Explained to Rebecca Schneider that she did not need a Pap smear today due to last Pap smear was 04/04/2014. Let her know BCCCP will cover Pap smears every 3 years unless has a history of abnormal Pap smears. [facility]. Appointment scheduled for Thursday, May 21, 2016 at 1510.  Rebecca Schneider verbalized understanding.  Anabel Lykins, Kathaleen Maserhristine Poll, RN 3:19 PM

## 2016-05-25 ENCOUNTER — Encounter (HOSPITAL_COMMUNITY): Payer: Self-pay | Admitting: *Deleted

## 2017-05-17 ENCOUNTER — Other Ambulatory Visit (HOSPITAL_COMMUNITY): Payer: Self-pay | Admitting: *Deleted

## 2017-05-17 DIAGNOSIS — R921 Mammographic calcification found on diagnostic imaging of breast: Secondary | ICD-10-CM

## 2017-05-27 ENCOUNTER — Encounter (HOSPITAL_COMMUNITY): Payer: Self-pay

## 2017-05-27 ENCOUNTER — Ambulatory Visit
Admission: RE | Admit: 2017-05-27 | Discharge: 2017-05-27 | Disposition: A | Discharge status: Home / Self Care | Payer: No Typology Code available for payment source | Source: Ambulatory Visit | Attending: Obstetrics and Gynecology | Admitting: Obstetrics and Gynecology

## 2017-05-27 ENCOUNTER — Other Ambulatory Visit (HOSPITAL_COMMUNITY): Payer: Self-pay | Admitting: Obstetrics and Gynecology

## 2017-05-27 ENCOUNTER — Other Ambulatory Visit (HOSPITAL_COMMUNITY): Payer: Self-pay | Admitting: *Deleted

## 2017-05-27 ENCOUNTER — Ambulatory Visit (HOSPITAL_COMMUNITY)
Admission: RE | Admit: 2017-05-27 | Discharge: 2017-05-27 | Disposition: A | Discharge status: Home / Self Care | Payer: Self-pay | Source: Ambulatory Visit | Attending: Obstetrics and Gynecology | Admitting: Obstetrics and Gynecology

## 2017-05-27 VITALS — BP 110/70 | Ht 62.0 in | Wt 132.8 lb

## 2017-05-27 DIAGNOSIS — N6310 Unspecified lump in the right breast, unspecified quadrant: Secondary | ICD-10-CM

## 2017-05-27 DIAGNOSIS — Z01419 Encounter for gynecological examination (general) (routine) without abnormal findings: Secondary | ICD-10-CM

## 2017-05-27 DIAGNOSIS — R921 Mammographic calcification found on diagnostic imaging of breast: Secondary | ICD-10-CM

## 2017-05-27 NOTE — Patient Instructions (Signed)
Explained breast self awareness with Rebecca Schneider. Let patient know BCCCP will cover Pap smears and HPV typing every 5 years unless has a history of abnormal Pap smears. [facility] and possible left breast ultrasound. Appointment scheduled for Thursday, May 27, 2017 at 1520. Let patient know will follow up with her within the next couple weeks with results of Pap smear by phone. Rebecca Schneider verbalized understanding.  Brannock, Kathaleen Maserhristine Poll, RN 4:00 PM

## 2017-05-27 NOTE — Progress Notes (Signed)
Complaints of reddened spot on left breast that has a pimple like appearance.  Pap Smear: Pap smear completed today. [facility] and normal. Per patient has no history of an abnormal Pap smear.  Physical exam: Breasts Breasts symmetrical. No skin abnormalities left breast. Observed a reddish/purplish colored area that appeared to have been a sore that healed at 1 o'clock 3.5 cm from the nipple. No nipple retraction bilateral breasts. No nipple discharge bilateral breasts. No lymphadenopathy. No lumps palpated bilateral breasts. No complaints of pain or tenderness on exam. [facility] for a diagnostic mamm[facility] and possible left breast ultrasound. Appointment scheduled for Thursday, May 27, 2017 at 1520.  Pelvic/Bimanual   Ext Genitalia No lesions, no swelling and no discharge observed on external genitalia.         Vagina Vagina pink and normal texture. No lesions or discharge observed in vagina.          Cervix Cervix is present. Cervix pink and of normal texture. No discharge observed.     Uterus Uterus is present and palpable. Uterus in normal position and normal size.        Adnexae Bilateral ovaries present and palpable. No tenderness on palpation.          Rectovaginal No rectal exam completed today since patient had no rectal complaints. No skin abnormalities observed on exam.    Smoking History: Patient has never smoked.  Patient Navigation: Patient education provided. Access to services provided for patient [facility]. Spanish interpreter provided.  Used Spanish interpreter [employer] States Steel Corporationlis Herrera from [employer].

## 2017-05-28 ENCOUNTER — Encounter (HOSPITAL_COMMUNITY): Payer: Self-pay | Admitting: *Deleted

## 2017-05-31 LAB — CYTOLOGY - PAP
Diagnosis: NEGATIVE
HPV (WINDOPATH): NOT DETECTED

## 2017-06-01 ENCOUNTER — Encounter (HOSPITAL_COMMUNITY): Payer: Self-pay | Admitting: *Deleted

## 2017-06-01 NOTE — Progress Notes (Signed)
Letter mailed to patient concerning negative pap smear results. HPV was negative. Next pap smear due in five years.  

## 2017-07-11 ENCOUNTER — Encounter (HOSPITAL_COMMUNITY): Payer: Self-pay | Admitting: Emergency Medicine

## 2017-07-11 DIAGNOSIS — R51 Headache: Secondary | ICD-10-CM | POA: Insufficient documentation

## 2017-07-11 DIAGNOSIS — R112 Nausea with vomiting, unspecified: Secondary | ICD-10-CM | POA: Insufficient documentation

## 2017-07-11 LAB — URINALYSIS, ROUTINE W REFLEX MICROSCOPIC
BILIRUBIN URINE: NEGATIVE
GLUCOSE, UA: NEGATIVE mg/dL
HGB URINE DIPSTICK: NEGATIVE
KETONES UR: NEGATIVE mg/dL
Leukocytes, UA: NEGATIVE
Nitrite: NEGATIVE
PROTEIN: NEGATIVE mg/dL
Specific Gravity, Urine: 1.004 — ABNORMAL LOW (ref 1.005–1.030)
pH: 7 (ref 5.0–8.0)

## 2017-07-11 LAB — COMPREHENSIVE METABOLIC PANEL
ALT: 16 U/L (ref 14–54)
ANION GAP: 7 (ref 5–15)
AST: 16 U/L (ref 15–41)
Albumin: 3.4 g/dL — ABNORMAL LOW (ref 3.5–5.0)
Alkaline Phosphatase: 48 U/L (ref 38–126)
BUN: 18 mg/dL (ref 6–20)
CO2: 24 mmol/L (ref 22–32)
CREATININE: 0.9 mg/dL (ref 0.44–1.00)
Calcium: 8.5 mg/dL — ABNORMAL LOW (ref 8.9–10.3)
Chloride: 105 mmol/L (ref 101–111)
Glucose, Bld: 94 mg/dL (ref 65–99)
Potassium: 4 mmol/L (ref 3.5–5.1)
SODIUM: 136 mmol/L (ref 135–145)
Total Bilirubin: 0.4 mg/dL (ref 0.3–1.2)
Total Protein: 6.7 g/dL (ref 6.5–8.1)

## 2017-07-11 LAB — CBC
HCT: 37 % (ref 36.0–46.0)
Hemoglobin: 12.3 g/dL (ref 12.0–15.0)
MCH: 29.5 pg (ref 26.0–34.0)
MCHC: 33.2 g/dL (ref 30.0–36.0)
MCV: 88.7 fL (ref 78.0–100.0)
PLATELETS: 230 10*3/uL (ref 150–400)
RBC: 4.17 MIL/uL (ref 3.87–5.11)
RDW: 12.6 % (ref 11.5–15.5)
WBC: 6.9 10*3/uL (ref 4.0–10.5)

## 2017-07-11 LAB — POC URINE PREG, ED: Preg Test, Ur: NEGATIVE

## 2017-07-11 LAB — LIPASE, BLOOD: LIPASE: 41 U/L (ref 11–51)

## 2017-07-11 NOTE — ED Triage Notes (Signed)
Patient with UTI that is being treated with Bactrim and Ibuprofen that she started yesterday.  She is now having nausea and vomiting, headache and continues with abdominal pain.  Patient states she has been having fevers.  She does not know if she is pregnant.

## 2017-07-12 ENCOUNTER — Emergency Department (HOSPITAL_COMMUNITY)
Admission: EM | Admit: 2017-07-12 | Discharge: 2017-07-12 | Disposition: A | Discharge status: Home / Self Care | Payer: No Typology Code available for payment source | Attending: Emergency Medicine | Admitting: Emergency Medicine

## 2017-07-12 DIAGNOSIS — R51 Headache: Secondary | ICD-10-CM

## 2017-07-12 DIAGNOSIS — R112 Nausea with vomiting, unspecified: Secondary | ICD-10-CM

## 2017-07-12 DIAGNOSIS — R519 Headache, unspecified: Secondary | ICD-10-CM

## 2017-07-12 MED ORDER — SODIUM CHLORIDE 0.9 % IV BOLUS (SEPSIS)
1000.0000 mL | Freq: Once | INTRAVENOUS | Status: AC
  Administered 2017-07-12: 1000 mL via INTRAVENOUS

## 2017-07-12 MED ORDER — METOCLOPRAMIDE HCL 5 MG/ML IJ SOLN
10.0000 mg | Freq: Once | INTRAMUSCULAR | Status: AC
  Administered 2017-07-12: 10 mg via INTRAVENOUS
  Filled 2017-07-12: qty 2

## 2017-07-12 MED ORDER — DIPHENHYDRAMINE HCL 50 MG/ML IJ SOLN
25.0000 mg | Freq: Once | INTRAMUSCULAR | Status: AC
  Administered 2017-07-12: 25 mg via INTRAVENOUS
  Filled 2017-07-12: qty 1

## 2017-07-12 MED ORDER — METOCLOPRAMIDE HCL 10 MG PO TABS: 10.0000 mg | ORAL_TABLET | Freq: Four times a day (QID) | ORAL | 0 refills | Status: DC | PRN

## 2017-07-12 NOTE — ED Provider Notes (Signed)
[facility] DEPT Provider Note   CSN: [id number] Arrival date & time: 07/11/17  2100     History   Chief Complaint Chief Complaint  Patient presents with  . Headache  . Abdominal Pain    HPI Rebecca Schneider is a 31 y.o. female.  The history is provided by the patient.  She comes in today because of headache, vomiting, and abdominal pain. She had been seen in the clinic 2 days ago and diagnosed with a urinary tract infection. At that time, she was having urinary frequency and dysuria. She was given prescriptions for trimethoprim-sulfamethoxazole, and ibuprofen. She started vomiting at about noon today and has vomited twice. She is also complaining of pain in the left upper quadrant and epigastric area. At about the same time, she developed a global headache which is described as a throbbing sensation. She denies any visual change. She denies weakness, numbness, tingling. She is also wondering whether she is pregnant.  Past Medical History:  Diagnosis Date  . Hypotension    began after delivery of baby 11/01/14    Patient Active Problem List   Diagnosis Date Noted  . Vaginal delivery 11/01/2014  . Status post vaginal delivery 05/02/2014    History reviewed. No pertinent surgical history.  OB History    Gravida Para Term Preterm AB Living   2 2 2     2    SAB TAB Ectopic Multiple Live Births         0 2       Home Medications    Prior to Admission medications   Medication Sig Start Date End Date Taking? Authorizing Provider  ibuprofen (ADVIL,MOTRIN) 600 MG tablet Take 1 tablet (600 mg total) by mouth every 6 (six) hours as needed. Patient not taking: Reported on 05/21/2016 11/02/14   Arabella Merles, CNM  norethindrone (ORTHO MICRONOR) 0.35 MG tablet Starting on 11/25/14 take one pill by mouth everyday Patient not taking: Reported on 12/12/2014 11/02/14   Arabella Merles, CNM  Prenatal Vit-Fe Fumarate-FA (PRENATAL VITAMINS) 28-0.8 MG TABS 1 po q d Patient not  taking: Reported on 05/27/2017 03/07/14   Hayden Rasmussen, NP    Family History Family History  Problem Relation Age of Onset  . Cancer Brother        neck  . Diabetes Maternal Grandfather     Social History Social History  Substance Use Topics  . Smoking status: Never Smoker  . Smokeless tobacco: Never Used  . Alcohol use No     Allergies   Patient has no known allergies.   Review of Systems Review of Systems  All other systems reviewed and are negative.    Physical Exam Updated Vital Signs BP 98/64 (BP Location: Right Arm)   Pulse 77   Temp 98.3 F (36.8 C) (Oral)   Resp 18   LMP 06/13/2017 (Approximate)   SpO2 100%   Physical Exam  Nursing note and vitals reviewed.  31 year old female, resting comfortably and in no acute distress. Vital signs are normal. Oxygen saturation is 100%, which is normal. Head is normocephalic and atraumatic. PERRLA, EOMI. Oropharynx is clear. There is tenderness to palpation over the temporalis muscles bilaterally, and over the insertion of the paracervical muscles bilaterally. Neck is nontender and supple without adenopathy or JVD. Back is nontender and there is no CVA tenderness. Lungs are clear without rales, wheezes, or rhonchi. Chest is nontender. Heart has regular rate and rhythm without murmur. Abdomen is soft, flat, with mild  epigastric tenderness. There is no rebound or guarding. There are no masses or hepatosplenomegaly and peristalsis is hypoactive. Extremities have no cyanosis or edema, full range of motion is present. Skin is warm and dry without rash. Neurologic: Mental status is normal, cranial nerves are intact, there are no motor or sensory deficits.  ED Treatments / Results  Labs (all labs ordered are listed, but only abnormal results are displayed) Labs Reviewed  COMPREHENSIVE METABOLIC PANEL - Abnormal; Notable for the following:       Result Value   Calcium 8.5 (*)    Albumin 3.4 (*)    All other components  within normal limits  URINALYSIS, ROUTINE W REFLEX MICROSCOPIC - Abnormal; Notable for the following:    Color, Urine COLORLESS (*)    Specific Gravity, Urine 1.004 (*)    All other components within normal limits  LIPASE, BLOOD  CBC  POC URINE PREG, ED   Procedures Procedures (including critical care time)  Medications Ordered in ED Medications  sodium chloride 0.9 % bolus 1,000 mL (1,000 mLs Intravenous New Bag/Given 07/12/17 0101)  metoCLOPramide (REGLAN) injection 10 mg (10 mg Intravenous Given 07/12/17 0101)  diphenhydrAMINE (BENADRYL) injection 25 mg (25 mg Intravenous Given 07/12/17 0101)     Initial Impression / Assessment and Plan / ED Course  I have reviewed the triage vital signs and the nursing notes.  Pertinent lab results that were available during my care of the patient were reviewed by me and considered in my medical decision making (see chart for details).  Recent urinary tract infection which appears to have been treated adequately. Urinalysis today is normal. Pregnancy test is negative. Abdominal pain, nausea, vomiting may be partly side effect of ibuprofen. Headache has clinical characteristics of muscle contraction headache, but aspects that do sound like migraine headache. CBC and metabolic panel are normal. She is given a headache cocktail of normal saline, metoclopramide, diphenhydramine and will be reassessed. Old records are reviewed, and she has no relevant past visits.  She feels much better after above noted treatment. She is discharged with prescription for metoclopramide, advised to stop taking ibuprofen.  Final Clinical Impressions(s) / ED Diagnoses   Final diagnoses:  Non-intractable vomiting with nausea, unspecified vomiting type  Bad headache    New Prescriptions New Prescriptions   METOCLOPRAMIDE (REGLAN) 10 MG TABLET    Take 1 tablet (10 mg total) by mouth every 6 (six) hours as needed for nausea (or headache).     Dione BoozeGlick, Henrick Mcgue, MD 07/12/17  [phone]

## 2017-07-12 NOTE — Discharge Instructions (Signed)
Contine tomando su antibitico hasta que desaparezca por completo.  Deje de tomar ibuprofeno: Associate Professorpuede hacer que le duela el estmago.

## 2017-12-03 ENCOUNTER — Other Ambulatory Visit (HOSPITAL_COMMUNITY): Payer: Self-pay | Admitting: Obstetrics and Gynecology

## 2017-12-03 ENCOUNTER — Other Ambulatory Visit: Payer: Self-pay | Admitting: Obstetrics and Gynecology

## 2017-12-03 ENCOUNTER — Ambulatory Visit
Admission: RE | Admit: 2017-12-03 | Discharge: 2017-12-03 | Disposition: A | Discharge status: Home / Self Care | Payer: No Typology Code available for payment source | Source: Ambulatory Visit | Attending: Obstetrics and Gynecology | Admitting: Obstetrics and Gynecology

## 2017-12-03 DIAGNOSIS — N6310 Unspecified lump in the right breast, unspecified quadrant: Secondary | ICD-10-CM

## 2017-12-03 DIAGNOSIS — Z1231 Encounter for screening mammogram for malignant neoplasm of breast: Secondary | ICD-10-CM

## 2017-12-09 ENCOUNTER — Other Ambulatory Visit: Payer: Self-pay | Admitting: Obstetrics and Gynecology

## 2017-12-09 DIAGNOSIS — N63 Unspecified lump in unspecified breast: Secondary | ICD-10-CM

## 2018-06-10 ENCOUNTER — Other Ambulatory Visit: Payer: No Typology Code available for payment source

## 2018-06-17 ENCOUNTER — Other Ambulatory Visit (HOSPITAL_COMMUNITY): Payer: Self-pay | Admitting: Obstetrics and Gynecology

## 2018-06-17 ENCOUNTER — Other Ambulatory Visit (HOSPITAL_COMMUNITY): Payer: Self-pay | Admitting: *Deleted

## 2018-06-17 DIAGNOSIS — N6310 Unspecified lump in the right breast, unspecified quadrant: Secondary | ICD-10-CM

## 2018-07-14 ENCOUNTER — Ambulatory Visit
Admission: RE | Admit: 2018-07-14 | Discharge: 2018-07-14 | Disposition: A | Discharge status: Home / Self Care | Payer: No Typology Code available for payment source | Source: Ambulatory Visit | Attending: Obstetrics and Gynecology | Admitting: Obstetrics and Gynecology

## 2018-07-14 ENCOUNTER — Encounter (HOSPITAL_COMMUNITY): Payer: Self-pay

## 2018-07-14 ENCOUNTER — Ambulatory Visit (HOSPITAL_COMMUNITY)
Admission: RE | Admit: 2018-07-14 | Discharge: 2018-07-14 | Disposition: A | Discharge status: Home / Self Care | Payer: Self-pay | Source: Ambulatory Visit | Attending: Obstetrics and Gynecology | Admitting: Obstetrics and Gynecology

## 2018-07-14 VITALS — BP 110/70 | Ht 63.0 in

## 2018-07-14 DIAGNOSIS — Z1239 Encounter for other screening for malignant neoplasm of breast: Secondary | ICD-10-CM

## 2018-07-14 DIAGNOSIS — N6310 Unspecified lump in the right breast, unspecified quadrant: Secondary | ICD-10-CM

## 2018-07-14 DIAGNOSIS — N644 Mastodynia: Secondary | ICD-10-CM

## 2018-07-14 NOTE — Patient Instructions (Signed)
Explained breast self awareness with Rebecca Schneider. Patient did not need a Pap smear today due to last Pap smear and HPV typing was 05/27/2017. Let her know BCCCP will cover Pap smears and HPV typing every 5 years unless has a history of abnormal Pap smears. [facility] and right breast ultrasound. Appointment scheduled for Thursday, July 14, 2018 at 1450. Rebecca Schneider verbalized understanding.  Brannock, Kathaleen Maserhristine Poll, RN 2:21 PM

## 2018-07-14 NOTE — Progress Notes (Signed)
Complaints of right breast lump that has been followed. Patient complained of right breast pain x 2 months that is constant. Patient rates the pain at a 3 out of 10. Patient had a right breast diagnostic mammogram and ultrasound completed 12/03/2017 that 2472-month follow-up is recommended. Patient  Pap Smear: Pap smear not completed today. [facility] and normal with negative HPV. Per patient has no history of an abnormal Pap smear. Last two Pap smear results are in Epic.  Physical exam: Breasts Breasts symmetrical. No skin abnormalities bilateral breasts. No nipple retraction bilateral breasts. No nipple discharge bilateral breasts. No lymphadenopathy. No lumps palpated left breast. Palpated a pea sized lump within the right breast at 12:30 o'clock 1 cm from the nipple. Complaints of right lower outer breast tenderness on exam. [facility] a [facility] mamm[facility] and right breast ultrasound. Appointment scheduled for Thursday, July 14, 2018 at 1450.        Pelvic/Bimanual No Pap smear completed today since last Pap smear and HPV typing was 05/27/2017. Pap smear not indicated per BCCCP guidelines.   Smoking History: Patient has never smoked.  Patient Navigation: Patient education provided. Access to services provided [facility]. Spanish interpreter provided.   Breast and Cervical Cancer Risk Assessment: Patient has no family history of breast cancer, known genetic mutations, or radiation treatment to the chest before age 32. Patient has no history of cervical dysplasia, immunocompromised, or DES exposure in-utero. Breast cancer risk assessment completed. No risk calculated due to patient is less than 32 years old.  Used Spanish interpreter Celanese CorporationErika McReynolds from [employer].

## 2019-06-27 ENCOUNTER — Ambulatory Visit (HOSPITAL_COMMUNITY)
Admission: EM | Admit: 2019-06-27 | Discharge: 2019-06-27 | Disposition: A | Discharge status: Home / Self Care | Payer: Self-pay | Attending: Family Medicine | Admitting: Family Medicine

## 2019-06-27 ENCOUNTER — Encounter (HOSPITAL_COMMUNITY): Payer: Self-pay

## 2019-06-27 ENCOUNTER — Other Ambulatory Visit: Payer: Self-pay

## 2019-06-27 DIAGNOSIS — F419 Anxiety disorder, unspecified: Secondary | ICD-10-CM

## 2019-06-27 LAB — POCT PREGNANCY, URINE: Preg Test, Ur: NEGATIVE

## 2019-06-27 MED ORDER — PROCHLORPERAZINE MALEATE 10 MG PO TABS: 10.0000 mg | ORAL_TABLET | Freq: Two times a day (BID) | ORAL | 0 refills | Status: DC | PRN

## 2019-06-27 NOTE — ED Provider Notes (Signed)
[facility]    CSN: [id number] Arrival date & time: 06/27/19  1119     History   Chief Complaint Chief Complaint  Patient presents with  . Panic Attack    HPI York CeriseYesenia Fernandez is a 33 y.o. female.   Patient presents with "panic attack" x5 days.  She states she feels anxiety in her stomach and feels nervous; she states when she feels this way she gets so nervous that she vomits.  She states she has had panic attacks in the past, 1 year ago and 3 years ago; she was seen by a doctor at that time and was given unknown medication which made her sleepy and she could not take this due to her 2 young children.  LMP: 4 weeks.  The history is provided by the patient. A language interpreter was used.    Past Medical History:  Diagnosis Date  . Hypotension    began after delivery of baby 11/01/14    Patient Active Problem List   Diagnosis Date Noted  . Vaginal delivery 11/01/2014  . Status post vaginal delivery 05/02/2014    History reviewed. No pertinent surgical history.  OB History    Gravida  2   Para  2   Term  2   Preterm      AB      Living  2     SAB      TAB      Ectopic      Multiple  0   Live Births  2            Home Medications    Prior to Admission medications   Medication Sig Start Date End Date Taking? Authorizing Provider  ibuprofen (ADVIL,MOTRIN) 600 MG tablet Take 1 tablet (600 mg total) by mouth every 6 (six) hours as needed. Patient not taking: Reported on 07/14/2018 11/02/14   Arabella MerlesShaw, Kimberly D, CNM  metoCLOPramide (REGLAN) 10 MG tablet Take 1 tablet (10 mg total) by mouth every 6 (six) hours as needed for nausea (or headache). Patient not taking: Reported on 07/14/2018 07/12/17   Dione BoozeGlick, David, MD  prochlorperazine (COMPAZINE) 10 MG tablet Take 1 tablet (10 mg total) by mouth 2 (two) times daily as needed for nausea or vomiting. 06/27/19   Mickie Bailate, Keidra Withers H, NP  sulfamethoxazole-trimethoprim (BACTRIM DS,SEPTRA DS) 800-160 MG tablet  Take 1 tablet by mouth 2 (two) times daily.    [provider]    Family History Family History  Problem Relation Age of Onset  . Cancer Brother        neck  . Diabetes Maternal Grandfather     Social History Social History   Tobacco Use  . Smoking status: Never Smoker  . Smokeless tobacco: Never Used  Substance Use Topics  . Alcohol use: No  . Drug use: No     Allergies   Ibuprofen   Review of Systems Review of Systems  Constitutional: Negative for chills and fever.  HENT: Negative for ear pain and sore throat.   Eyes: Negative for pain and visual disturbance.  Respiratory: Negative for cough and shortness of breath.   Cardiovascular: Negative for chest pain and palpitations.  Gastrointestinal: Positive for vomiting. Negative for abdominal pain.  Genitourinary: Negative for dysuria and hematuria.  Musculoskeletal: Negative for arthralgias and back pain.  Skin: Negative for color change and rash.  Neurological: Negative for seizures and syncope.  Psychiatric/Behavioral: The patient is nervous/anxious.   All other systems reviewed and  are negative.    Physical Exam Triage Vital Signs ED Triage Vitals  Enc Vitals Group     BP 06/27/19 1217 109/73     Pulse Rate 06/27/19 1217 69     Resp 06/27/19 1217 16     Temp 06/27/19 1217 97.9 F (36.6 C)     Temp Source 06/27/19 1217 Oral     SpO2 06/27/19 1217 100 %     Weight 06/27/19 1230 135 lb (61.2 kg)     Height --      Head Circumference --      Peak Flow --      Pain Score 06/27/19 1230 1     Pain Loc --      Pain Edu? --      Excl. in [place]? --    No data found.  Updated Vital Signs BP 109/73 (BP Location: Left Arm)   Pulse 69   Temp 97.9 F (36.6 C) (Oral)   Resp 16   Wt 130 lb (59 kg)   LMP 06/20/2019   SpO2 100%   BMI 23.03 kg/m   Visual Acuity Right Eye Distance:   Left Eye Distance:   Bilateral Distance:    Right Eye Near:   Left Eye Near:    Bilateral Near:     Physical  Exam Vitals signs and nursing note reviewed.  Constitutional:      General: She is not in acute distress.    Appearance: She is well-developed.     Comments: Well-appearing.  HENT:     Head: Normocephalic and atraumatic.     Right Ear: Tympanic membrane normal.     Left Ear: Tympanic membrane normal.     Mouth/Throat:     Mouth: Mucous membranes are moist.     Pharynx: Oropharynx is clear.  Eyes:     Extraocular Movements: Extraocular movements intact.     Conjunctiva/sclera: Conjunctivae normal.     Pupils: Pupils are equal, round, and reactive to light.  Neck:     Musculoskeletal: Neck supple.  Cardiovascular:     Rate and Rhythm: Normal rate and regular rhythm.     Heart sounds: Normal heart sounds.  Pulmonary:     Effort: Pulmonary effort is normal. No respiratory distress.     Breath sounds: Normal breath sounds.  Abdominal:     Palpations: Abdomen is soft.     Tenderness: There is no abdominal tenderness. There is no guarding or rebound.  Skin:    General: Skin is warm and dry.  Neurological:     General: No focal deficit present.     Mental Status: She is alert and oriented to person, place, and time.     Cranial Nerves: No cranial nerve deficit.     Sensory: No sensory deficit.     Motor: No weakness.     Coordination: Coordination normal.     Gait: Gait normal.     Deep Tendon Reflexes: Reflexes normal.  Psychiatric:        Behavior: Behavior normal.        Thought Content: Thought content normal.        Judgment: Judgment normal.     Comments: Intermittently quietly tearful.      UC Treatments / Results  Labs (all labs ordered are listed, but only abnormal results are displayed) Labs Reviewed  POC URINE PREG, ED  POCT PREGNANCY, URINE    EKG   Radiology No results found.  Procedures Procedures (including critical  care time)  Medications Ordered in UC Medications - No data to display  Initial Impression / Assessment and Plan / UC Course  I  have reviewed the triage vital signs and the nursing notes.  Pertinent labs & imaging results that were available during my care of the patient were reviewed by me and considered in my medical decision making (see chart for details).   Anxiety, mild.  Treating today with short course of Compazine; instructed patient not to drive, operate machinery, drink alcohol while taking this medication.  Discussed with patient that there are long-term medications that can help her anxiety but that she needs to establish herself with a PCP for ongoing follow-up.  [facility]; address and phone number given.     Final Clinical Impressions(s) / UC Diagnoses   Final diagnoses:  Anxiety     Discharge Instructions     Take the prescribed medication as needed for anxiety.  This medication will make you sleepy so do not drive a car, operate machinery, or drink alcohol while taking it.    Called the doctor's office listed to make an appointment to be seen.  You should talk to this doctor about your anxiety and long-term medications that can help this.        ED Prescriptions    Medication Sig Dispense Auth. Provider   prochlorperazine (COMPAZINE) 10 MG tablet Take 1 tablet (10 mg total) by mouth 2 (two) times daily as needed for nausea or vomiting. 10 tablet Mickie Bailate, Megha Agnes H, NP     Controlled Substance Prescriptions [place] Controlled Substance Registry consulted? Not Applicable   Mickie Bailate, Rooney Gladwin H, NP 06/27/19 1346

## 2019-06-27 NOTE — Discharge Instructions (Addendum)
Take the prescribed medication as needed for anxiety.  This medication will make you sleepy so do not drive a car, operate machinery, or drink alcohol while taking it.    Call the doctor's office listed to make an appointment to be seen.  You should talk to this doctor about your anxiety and long-term medications that can help this.

## 2019-06-27 NOTE — ED Triage Notes (Signed)
Pt states she has been having a panic attack this started at 10 am today.

## 2019-07-13 ENCOUNTER — Other Ambulatory Visit (HOSPITAL_COMMUNITY): Payer: Self-pay | Admitting: *Deleted

## 2019-07-13 DIAGNOSIS — Z1231 Encounter for screening mammogram for malignant neoplasm of breast: Secondary | ICD-10-CM

## 2019-08-01 ENCOUNTER — Other Ambulatory Visit: Payer: Self-pay

## 2019-08-01 ENCOUNTER — Ambulatory Visit
Admission: RE | Admit: 2019-08-01 | Discharge: 2019-08-01 | Disposition: A | Discharge status: Home / Self Care | Payer: No Typology Code available for payment source | Source: Ambulatory Visit | Attending: Obstetrics and Gynecology | Admitting: Obstetrics and Gynecology

## 2019-08-01 ENCOUNTER — Ambulatory Visit (HOSPITAL_COMMUNITY)
Admission: RE | Admit: 2019-08-01 | Discharge: 2019-08-01 | Disposition: A | Discharge status: Home / Self Care | Payer: No Typology Code available for payment source | Source: Ambulatory Visit | Attending: Obstetrics and Gynecology | Admitting: Obstetrics and Gynecology

## 2019-08-01 DIAGNOSIS — Z1231 Encounter for screening mammogram for malignant neoplasm of breast: Secondary | ICD-10-CM

## 2020-10-05 ENCOUNTER — Ambulatory Visit: Payer: Self-pay

## 2020-10-05 ENCOUNTER — Other Ambulatory Visit: Payer: Self-pay

## 2020-10-05 DIAGNOSIS — Z23 Encounter for immunization: Secondary | ICD-10-CM

## 2020-11-02 ENCOUNTER — Ambulatory Visit (INDEPENDENT_AMBULATORY_CARE_PROVIDER_SITE_OTHER): Payer: Self-pay

## 2020-11-02 ENCOUNTER — Other Ambulatory Visit: Payer: Self-pay

## 2020-11-02 VITALS — Wt 140.0 lb

## 2020-11-02 DIAGNOSIS — Z23 Encounter for immunization: Secondary | ICD-10-CM

## 2020-11-02 NOTE — Progress Notes (Signed)
   Covid-19 Vaccination Clinic  Name:  Rebecca Schneider    MRN: [id number] DOB: [birth year]  11/02/2020  Ms. Rebecca Schneider was observed post Covid-19 immunization for 15 minutes without incident. She was provided with Vaccine Information Sheet and instruction to access the V-Safe system.   Ms. Rebecca Schneider was instructed to call 911 with any severe reactions post vaccine: Marland Kitchen Difficulty breathing  . Swelling of face and throat  . A fast heartbeat  . A bad rash all over body  . Dizziness and weakness   Immunizations Administered    Name Date Dose VIS Date Route   Pfizer COVID-19 Vaccine 11/02/2020 11:23 AM 0.3 mL 09/18/2020 Intramuscular   Manufacturer: [employer], [employer]   Lot: [id number]   NDC: [address]-2

## 2020-11-14 NOTE — Progress Notes (Signed)
   Covid-19 Vaccination Clinic  Name:  Rebecca Schneider    MRN: [id number] DOB: 03-Sep-1986  11/14/2020  Ms. Rebecca Schneider was observed post Covid-19 immunization for 15 minutes without incident. She was provided with Vaccine Information Sheet and instruction to access the V-Safe system.   Ms. Rebecca Schneider was instructed to call 911 with any severe reactions post vaccine: Marland Kitchen Difficulty breathing  . Swelling of face and throat  . A fast heartbeat  . A bad rash all over body  . Dizziness and weakness   Immunizations Administered    Name Date Dose VIS Date Route   Pfizer COVID-19 Vaccine 10/05/2020 11:37 AM 0.3 mL 09/18/2020 Intramuscular   Manufacturer: [employer], [employer]   Lot: [id number]   NDC: [address]-4

## 2021-05-28 ENCOUNTER — Encounter: Payer: Self-pay | Admitting: Nurse Practitioner

## 2021-05-28 ENCOUNTER — Encounter (INDEPENDENT_AMBULATORY_CARE_PROVIDER_SITE_OTHER): Payer: Self-pay

## 2021-05-28 ENCOUNTER — Encounter: Payer: Self-pay | Admitting: *Deleted

## 2021-05-28 ENCOUNTER — Ambulatory Visit: Payer: No Typology Code available for payment source | Admitting: Nurse Practitioner

## 2021-05-28 ENCOUNTER — Ambulatory Visit (INDEPENDENT_AMBULATORY_CARE_PROVIDER_SITE_OTHER): Payer: No Typology Code available for payment source | Admitting: Nurse Practitioner

## 2021-05-28 ENCOUNTER — Other Ambulatory Visit: Payer: Self-pay

## 2021-05-28 VITALS — BP 115/96 | HR 83 | Temp 98.2°F | Ht 61.0 in | Wt 139.1 lb

## 2021-05-28 DIAGNOSIS — Z0000 Encounter for general adult medical examination without abnormal findings: Secondary | ICD-10-CM

## 2021-05-28 DIAGNOSIS — N898 Other specified noninflammatory disorders of vagina: Secondary | ICD-10-CM

## 2021-05-28 DIAGNOSIS — Z113 Encounter for screening for infections with a predominantly sexual mode of transmission: Secondary | ICD-10-CM

## 2021-05-28 DIAGNOSIS — Z01419 Encounter for gynecological examination (general) (routine) without abnormal findings: Secondary | ICD-10-CM

## 2021-05-28 DIAGNOSIS — Z131 Encounter for screening for diabetes mellitus: Secondary | ICD-10-CM

## 2021-05-28 DIAGNOSIS — Z130 Encounter for screening for diseases of the blood and blood-forming organs and certain disorders involving the immune mechanism: Secondary | ICD-10-CM

## 2021-05-28 LAB — POCT URINALYSIS DIPSTICK
Bilirubin, UA: NEGATIVE
Blood, UA: NEGATIVE
Glucose, UA: NEGATIVE
Ketones, UA: NEGATIVE
Leukocytes, UA: NEGATIVE
Nitrite, UA: NEGATIVE
Protein, UA: NEGATIVE
Spec Grav, UA: 1.025 (ref 1.010–1.025)
Urobilinogen, UA: 1 E.U./dL
pH, UA: 7 (ref 5.0–8.0)

## 2021-05-28 NOTE — Progress Notes (Signed)
[facility] Patient [facility] [address] [place], [place]  [address] Phone:  [phone]   Fax:  [phone]    Established Patient Office Visit  Subjective:  Patient ID: Rebecca Schneider, female    DOB: 06-28-1986  Age: 35 y.o. MRN: [id number]  CC:  Chief Complaint  Patient presents with   Establish Care    PAP Smear 2018. Noticed bumps outside of vaginal area in February stated her husband had always had bumps on groin area but she has never had any outbreaks. Dizziness and ear pain past month.     HPI Rebecca Schneider presents to establish care.   Skin Lesion She describes it as a bump, that is located on her  vaginal .  It was first noticed a few months ago. It has been causing itching and soreness that she has noticed in   and is none. Previously this spot has been  vagiseal ; it helped with the soerness but not the lesion . Other spots that are concerning: none   Past Medical History:  Diagnosis Date   Hypotension    began after delivery of baby 11/01/14    History reviewed. No pertinent surgical history.  Family History  Problem Relation Age of Onset   Cancer Brother        neck   Diabetes Maternal Grandfather     Social History   Socioeconomic History   Marital status: Married    Spouse name: Not on file   Number of children: Not on file   Years of education: Not on file   Highest education level: Not on file  Occupational History   Not on file  Tobacco Use   Smoking status: Never   Smokeless tobacco: Never  Vaping Use   Vaping Use: Never used  Substance and Sexual Activity   Alcohol use: No   Drug use: No   Sexual activity: Yes    Birth control/protection: None  Other Topics Concern   Not on file  Social History Narrative   Not on file   Social Determinants of Health   Financial Resource Strain: Not on file  Food Insecurity: Not on file  Transportation Needs: Not on file  Physical Activity: Not on file  Stress: Not on file   Social Connections: Not on file  Intimate Partner Violence: Not on file    Outpatient Medications Prior to Visit  Medication Sig Dispense Refill   hydroquinone 4 % cream Apply topically 2 (two) times daily.     ibuprofen (ADVIL,MOTRIN) 600 MG tablet Take 1 tablet (600 mg total) by mouth every 6 (six) hours as needed. (Patient not taking: Reported on 07/14/2018) 50 tablet 1   metoCLOPramide (REGLAN) 10 MG tablet Take 1 tablet (10 mg total) by mouth every 6 (six) hours as needed for nausea (or headache). (Patient not taking: Reported on 07/14/2018) 30 tablet 0   prochlorperazine (COMPAZINE) 10 MG tablet Take 1 tablet (10 mg total) by mouth 2 (two) times daily as needed for nausea or vomiting. (Patient not taking: Reported on 08/01/2019) 10 tablet 0   sulfamethoxazole-trimethoprim (BACTRIM DS,SEPTRA DS) 800-160 MG tablet Take 1 tablet by mouth 2 (two) times daily.     No facility-administered medications prior to visit.    Allergies  Allergen Reactions   Tylenol [Acetaminophen]     ROS Review of Systems  HENT:  Positive for ear pain (bilateral).        Sensitivity to noise buzzing in the ear  Endocrine: Positive for polydipsia.  Neurological:  Positive for dizziness (she gets this with polydipisa).       She feels like she is spinning  Nursery working      Objective:    Physical Exam Exam conducted with a chaperone present.  HENT:     Head: Normocephalic and atraumatic.  Cardiovascular:     Rate and Rhythm: Normal rate.     Pulses: Normal pulses.  Pulmonary:     Effort: Pulmonary effort is normal.  Chest:  Breasts:    Right: No axillary adenopathy or supraclavicular adenopathy.     Left: No axillary adenopathy or supraclavicular adenopathy.  Genitourinary:    General: Normal vulva.     Vagina: Normal.     Cervix: Discharge present.     Uterus: Normal.      Adnexa: Right adnexa normal and left adnexa normal.  Musculoskeletal:     Cervical back: Normal range of motion.   Lymphadenopathy:     Upper Body:     Right upper body: No supraclavicular, axillary or pectoral adenopathy.     Left upper body: No supraclavicular, axillary or pectoral adenopathy.  Neurological:     Mental Status: She is alert.   BP (!) 115/96   Pulse 83   Temp 98.2 F (36.8 C)   Ht _0  (1.549 m)   Wt 139 lb 0.8 oz (63.1 kg)   LMP 05/14/2021   SpO2 99%   BMI 26.27 kg/m  Wt Readings from Last 3 Encounters:  05/28/21 139 lb 0.8 oz (63.1 kg)  11/02/20 140 lb (63.5 kg)  08/01/19 137 lb (62.1 kg)     There are no preventive care reminders to display for this patient.   There are no preventive care reminders to display for this patient.  No results found for: TSH Lab Results  Component Value Date   WBC 6.8 05/28/2021   HGB 14.2 05/28/2021   HCT 42.3 05/28/2021   MCV 90 05/28/2021   PLT 200 05/28/2021   Lab Results  Component Value Date   NA 142 05/28/2021   K 4.2 05/28/2021   CO2 24 07/11/2017   GLUCOSE 91 05/28/2021   BUN 15 05/28/2021   CREATININE 0.69 05/28/2021   BILITOT 0.3 05/28/2021   ALKPHOS 64 05/28/2021   AST 16 05/28/2021   ALT 16 07/11/2017   PROT 7.4 05/28/2021   ALBUMIN 4.6 05/28/2021   CALCIUM 9.5 05/28/2021   ANIONGAP 7 07/11/2017   EGFR 116 05/28/2021   No results found for: CHOL No results found for: HDL No results found for: LDLCALC No results found for: TRIG No results found for: CHOLHDL No results found for: HGBA1C    Assessment & Plan:   Problem List Items Addressed This Visit   None Visit Diagnoses     Annual physical exam    -  Primary Discussed female health maintenance; SBE, annual CBE, PAP test Discussed general safety in vehicle and COVID Discussed regular hydration with water Discussed healthy diet and exercise and weight management Discussed sexual health  Discussed mental health Encouraged to call our office for an appointment with in ongoing concerns for questions.    Relevant Orders   Urinalysis Dipstick  (Completed)   Encounter for gynecological examination       Relevant Orders   IGP, CtNg, rfx Aptima HPV ASCU   Screening for STD (sexually transmitted disease)       Relevant Orders   HSV(herpes simplex vrs) 1+2 ab-IgG (Completed)  IGP, CtNg, rfx Aptima HPV ASCU   Hepatitis C antibody (Completed)   HIV Antibody (routine testing w rflx) (Completed)   RPR (Completed)   Vaginal lesion       Relevant Orders   HSV(herpes simplex vrs) 1+2 ab-IgG (Completed)   Screening for deficiency anemia       Relevant Orders   CBC with Differential/Platelet (Completed)   Screening for diabetes mellitus (DM)       Relevant Orders   Comp. Metabolic Panel (12)       No orders of the defined types were placed in this encounter.   Follow-up: No follow-ups on file.    Vevelyn Francois, NP

## 2021-05-28 NOTE — Patient Instructions (Signed)
Mantenimiento de Technical sales engineer en [place] Maintenance, Female Adoptar un estilo de vida saludable y recibir atencin preventiva son importantes para promover la salud y Musician. Consulte al mdico sobre: El esquema adecuado para hacerse pruebas y exmenes peridicos. Cosas que puede hacer por su cuenta para prevenir enfermedades y [employer]. Qu debo saber sobre la dieta, el peso y el ejercicio? Consuma una dieta saludable  Consuma una dieta que incluya muchas verduras, frutas, productos lcteos con bajo contenido de [place] y Advertising account planner. No consuma muchos alimentos ricos en grasas slidas, azcares agregados o sodio.  Mantenga un peso saludable El ndice de masa muscular [facility]) se [place] para identificar problemas de [place]. Proporciona una estimacin de la grasa corporal basndose en el peso y la altura. Su mdico puede ayudarle a Radiation protection practitioner [place] y a Scientist, forensic o Theatre manager unpeso saludable. Haga ejercicio con regularidad Haga ejercicio con regularidad. Esta es una de las prcticas ms importantes que puede hacer por su salud. La [employer] de los adultos deben seguir estas pautas: Optometrist, al menos, 150 minutos de actividad fsica por semana. El ejercicio debe aumentar la frecuencia cardaca y Nature conservation officer transpirar (ejercicio de intensidad moderada). Hacer ejercicios de fortalecimiento por lo [employer] por semana. Agregue esto a su plan de ejercicio de intensidad moderada. Pasar menos tiempo sentados. Incluso la actividad fsica ligera puede ser beneficiosa. Controle sus niveles de colesterol y lpidos en la sangre Comience a realizarse anlisis de lpidos y colesterol en la sangre a los20 aos y luego reptalos cada 5 aos. Hgase controlar los niveles de colesterol con mayor frecuencia si: Sus niveles de lpidos y colesterol son altos. Es mayor de 67 aos. Presenta un alto riesgo de padecer enfermedades cardacas. Qu debo saber sobre las pruebas de deteccin del  cncer? Segn su historia clnica y sus antecedentes familiares, es posible que deba realizarse pruebas de deteccin del cncer en diferentes edades. Esto puede incluir pruebas de deteccin de lo siguiente: Cncer de mama. Cncer de cuello uterino. Cncer colorrectal. Cncer de piel. Cncer de pulmn. Qu debo saber sobre la enfermedad cardaca, la diabetes y la hipertensinarterial? Presin arterial y enfermedad cardaca La hipertensin arterial causa enfermedades cardacas y [place] el riesgo de accidente cerebrovascular. Es ms probable que esto se manifieste en las personas que tienen lecturas de presin arterial alta, tienen ascendencia africana o tienen sobrepeso. Hgase controlar la presin arterial: Cada 3 a 5 aos si tiene entre 18 y 58 aos. Todos los aos si es mayor de 40 aos. Diabetes Realcese exmenes de deteccin de la diabetes con regularidad. Este anlisis revisa el nivel de azcar en la sangre en [place]. Hgase las pruebas de deteccin: Cada tres aos despus de los 68 aos de edad si tiene un peso normal y un bajo riesgo de padecer diabetes. Con ms frecuencia y a partir de [place] edad inferior si tiene sobrepeso o un alto riesgo de padecer diabetes. Qu debo saber sobre la prevencin de infecciones? Hepatitis B Si tiene un riesgo ms alto de contraer hepatitis B, debe someterse a un examen de deteccin de este virus. Hable con el mdico para averiguar si tiene riesgode contraer la infeccin por hepatitis B. Hepatitis C Se recomienda el anlisis a: [employer] 1945 y 1965. Todas las personas que tengan un riesgo de haber contrado hepatitis C. Enfermedades de transmisin sexual (ETS) Hgase las pruebas de Programme researcher, broadcasting/film/video de ITS, incluidas la gonorrea y la clamidia, si: Es sexualmente activa y es Garment/textile technologist de 24  aos. Es mayor de 24 aos, y el mdico le informa que corre riesgo de tener este tipo de infecciones. La actividad sexual ha cambiado desde que le  hicieron la ltima prueba de deteccin y tiene un riesgo mayor de Warehouse manager clamidia o Copy. Pregntele al mdico si usted tiene riesgo. Pregntele al mdico si usted tiene un alto riesgo de Primary school teacher VIH. El mdico tambin puede recomendarle un medicamento recetado para ayudar a evitar la infeccin por el VIH. Si elige tomar medicamentos para prevenir el VIH, primero debe [employer] de deteccin del VIH. Luego debe hacerse anlisis cada 3 meses mientras est tomando los medicamentos. Embarazo Si est por dejar de Armed forces training and education officer (fase premenopusica) y usted puede quedar [place], busque asesoramiento antes de [place]. T[place] de 400 a 800 microgramos (mcg) de cido [employer] si [place]. Pida mtodos de control de la natalidad (anticonceptivos) si desea evitar un embarazo no deseado. Osteoporosis y [place] La osteoporosis es una enfermedad en la que los huesos pierden los minerales y la fuerza por el avance de la edad. El resultado pueden ser fracturas en los [place]. Si tiene 65 aos o ms, o si est en riesgo de sufrir osteoporosis y fracturas, pregunte a su mdico si debe: Hacerse pruebas de deteccin de prdida sea. Tomar un suplemento de calcio o de vitamina D para reducir el riesgo de fracturas. Recibir terapia de reemplazo hormonal (TRH) para tratar los sntomas de la menopausia. Siga estas instrucciones en su casa: Estilo de vida No consuma ningn producto que contenga nicotina o tabaco, como cigarrillos, cigarrillos electrnicos y tabaco de Theatre manager. Si necesita ayuda para dejar de fumar, consulte al mdico. No consuma drogas. No comparta agujas. Solicite ayuda a su mdico si necesita apoyo o informacin para abandonar las drogas. Consumo de alcohol No beba alcohol si: Su mdico le indica no hacerlo. Est embarazada, puede estar embarazada o est tratando de [place]. Si bebe alcohol: Limite la cantidad que consume de 0 a 1 medida por  da. Limite la ingesta si est amamantando. Est atento a la cantidad de alcohol que hay en las bebidas que toma. En los [address], una medida equivale a una botella de cerveza de 12 oz (355 ml), un vaso de vino de 5 oz (148 ml) o un vaso de una bebida alcohlica de alta graduacin de 1 oz (44 ml). Instrucciones generales Realcese los estudios de rutina de la salud, dentales y de Wellsite geologist. Mantngase al da con las vacunas. Infrmele a su mdico si: Se siente deprimida con frecuencia. Alguna vez ha sido vctima de [place] o no se siente segura en su casa. Resumen Adoptar un estilo de vida saludable y recibir atencin preventiva son importantes para promover la salud y Counsellor. Siga las instrucciones del mdico acerca de una dieta saludable, el ejercicio y la realizacin de pruebas o exmenes para Hotel manager. Siga las instrucciones del mdico con respecto al control del colesterol y la presin arterial. Esta informacin no tiene Theme park manager el consejo del mdico. Asegresede hacerle al mdico cualquier pregunta que tenga. Document Revised: 12/07/2018 Document Reviewed: 12/07/2018 Elsevier Patient Education  2022 Elsevier Inc.   [employer] Dizziness Los mareos son un problema muy frecuente. Causan sensacin de inestabilidad o de desvanecimiento. Puede sentir que se va a desmayar. Los [employer] pueden provocarle una lesin si se tropieza o se cae. La causa puede deberse a [employer], tales como los siguientes: Medicamentos. No tener suficiente agua en el cuerpo (deshidratacin).  Enfermedad. Siga estas instrucciones en su casa: Comida y bebida  Beba suficiente lquido para mantener el pis (orina) de color amarillo plido. Esto evita la deshidratacin. Trate de beber ms lquidos transparentes, como agua. No beba alcohol. Limite la cantidad de cafena que bebe o come si el mdico se lo indica. Limite la cantidad de sal (sodio) que bebe o come si el mdico se  lo indica.  Actividad  Evite los movimientos rpidos. Cuando se levante de una silla, hgalo con lentitud y sujtese hasta sentirse bien. Por la maana, sintese primero a un lado de la cama. Cuando se sienta bien, pngase lentamente de pie mientras se sostiene de algo. Haga esto hasta que se sienta seguro en cuanto al equilibrio. Mueva las piernas con frecuencia si debe estar de pie en un lugar durante mucho tiempo. Mientras est de pie, contraiga y relaje los msculos de las piernas. No conduzca vehculos ni opere maquinaria si se siente mareado. Evite agacharse si se siente mareado. En su casa, coloque los objetos en algn lugar que le resulte fcil alcanzarlos sin agacharse.  Estilo de vida No fume ni consuma ningn producto que contenga nicotina o tabaco. Si necesita ayuda para dejar de fumar, consulte al mdico. Intente bajar el nivel de estrs. Para hacerlo, puede usar mtodos como el yoga o la meditacin. Hable con el mdico si necesita ayuda. Instrucciones generales Controle sus mareos para ver si hay cambios. Use los medicamentos de venta libre y los recetados solamente como se lo haya indicado el mdico. Hable con el mdico si cree que la causa de sus mareos es algn medicamento que est tomando. Infrmele a un amigo o a un familiar si se siente mareado. Pdale a esta persona que llame al mdico si observa cambios en su comportamiento. Concurra a todas las visitas de seguimiento. Comunquese con un mdico si: Los [employer]. Los [employer] o la sensacin de Production assistant, radio. Tiene ganas de vomitar (nuseas). Tiene problemas para escuchar. Aparecen nuevos sntomas. Siente inestabilidad al estar de pie. Siente que la Biochemist, clinical vueltas. Dolor o rigidez en el cuello. Tiene fiebre. Solicite ayuda de inmediato si: Vomita o tiene heces acuosas (diarrea), y no puede comer o beber nada. Tiene dificultad para hacer lo siguiente: Hablar. Caminar. Tragar. Usar los  brazos, las manos o las piernas. Se siente constantemente dbil. No piensa con claridad o tiene dificultad para armar oraciones. Es posible que un amigo o un familiar adviertan que esto ocurre. Tiene los siguientes sntomas: Journalist, newspaper. Dolor en el vientre (abdomen). Falta de aire. Sudoracin. Presenta cambios en la visin. Sangrado. Tiene un dolor de cabeza muy intenso. Estos sntomas pueden Customer service manager. Solicite ayuda de inmediato. Comunquese con el servicio de emergencias de su localidad (911 en los [place]). No espere a ver si los sntomas desaparecen. No conduzca por sus propios medios [employer]. Resumen Los mareos causan sensacin de inestabilidad o de desvanecimiento. Puede sentir que se va a desmayar. Beba suficiente lquido para Radio producer pis (orina) de color amarillo plido. No beba alcohol. Evite los movimientos rpidos si se siente mareado. Controle sus mareos para ver si hay cambios. Esta informacin no tiene Theme park manager el consejo del mdico. Asegresede hacerle al mdico cualquier pregunta que tenga. Document Revised: 11/11/2020 Document Reviewed: 11/11/2020 Elsevier Patient Education  2022 [employer].

## 2021-05-29 LAB — COMP. METABOLIC PANEL (12)
AST: 16 IU/L (ref 0–40)
Albumin/Globulin Ratio: 1.6 (ref 1.2–2.2)
Albumin: 4.6 g/dL (ref 3.8–4.8)
Alkaline Phosphatase: 64 IU/L (ref 44–121)
BUN/Creatinine Ratio: 22 (ref 9–23)
BUN: 15 mg/dL (ref 6–20)
Bilirubin Total: 0.3 mg/dL (ref 0.0–1.2)
Calcium: 9.5 mg/dL (ref 8.7–10.2)
Chloride: 102 mmol/L (ref 96–106)
Creatinine, Ser: 0.69 mg/dL (ref 0.57–1.00)
Globulin, Total: 2.8 g/dL (ref 1.5–4.5)
Glucose: 91 mg/dL (ref 65–99)
Potassium: 4.2 mmol/L (ref 3.5–5.2)
Sodium: 142 mmol/L (ref 134–144)
Total Protein: 7.4 g/dL (ref 6.0–8.5)
eGFR: 116 mL/min/{1.73_m2} (ref >59)

## 2021-05-29 LAB — CBC WITH DIFFERENTIAL/PLATELET
Basophils Absolute: 0 10*3/uL (ref 0.0–0.2)
Basos: 0 %
EOS (ABSOLUTE): 0.1 10*3/uL (ref 0.0–0.4)
Eos: 2 %
Hematocrit: 42.3 % (ref 34.0–46.6)
Hemoglobin: 14.2 g/dL (ref 11.1–15.9)
Immature Grans (Abs): 0 10*3/uL (ref 0.0–0.1)
Immature Granulocytes: 0 %
Lymphocytes Absolute: 2.1 10*3/uL (ref 0.7–3.1)
Lymphs: 31 %
MCH: 30.3 pg (ref 26.6–33.0)
MCHC: 33.6 g/dL (ref 31.5–35.7)
MCV: 90 fL (ref 79–97)
Monocytes Absolute: 0.5 10*3/uL (ref 0.1–0.9)
Monocytes: 7 %
Neutrophils Absolute: 4 10*3/uL (ref 1.4–7.0)
Neutrophils: 60 %
Platelets: 200 10*3/uL (ref 150–450)
RBC: 4.69 x10E6/uL (ref 3.77–5.28)
RDW: 12 % (ref 11.7–15.4)
WBC: 6.8 10*3/uL (ref 3.4–10.8)

## 2021-05-29 LAB — HIV ANTIBODY (ROUTINE TESTING W REFLEX): HIV Screen 4th Generation wRfx: NONREACTIVE

## 2021-05-29 LAB — HSV(HERPES SIMPLEX VRS) I + II AB-IGG
HSV 1 Glycoprotein G Ab, IgG: 51.2 index — ABNORMAL HIGH (ref 0.00–0.90)
HSV 2 IgG, Type Spec: <0.91 index (ref 0.00–0.90)

## 2021-05-29 LAB — HEPATITIS C ANTIBODY: Hep C Virus Ab: 0.1 s/co ratio (ref 0.0–0.9)

## 2021-05-29 LAB — RPR: RPR Ser Ql: NONREACTIVE

## 2021-05-29 NOTE — Progress Notes (Signed)
Error

## 2021-05-31 LAB — IGP, CTNG, RFX APTIMA HPV ASCU
Chlamydia, Nuc. Acid Amp: NEGATIVE
Gonococcus by Nucleic Acid Amp: NEGATIVE

## 2021-06-05 ENCOUNTER — Telehealth: Payer: Self-pay

## 2021-06-05 NOTE — Telephone Encounter (Signed)
Patient notified of results, advised her to purchase OTC Abreva if any outbreaks, Verbally understood.

## 2021-06-05 NOTE — Telephone Encounter (Signed)
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  This is generally treated as needed with over-the-counter Abreva.   If you would like to print her off some information and mail it ; this may be helpful    Cold sores are painful blisters that form on or near the lips and inside the mouth. They are caused by an infection with a virus called "herpes simplex virus." There are two types of herpes virus. Type 1 causes most cases of cold sores. Type 2 usually affects the penis or vagina. Cold sores are also known as "fever blisters."  The symptoms are different depending on whether it is the first time a person is getting cold sores. The first attack of cold sores usually happens during childhood. The first time a person gets cold sores, the symptoms can include: Painful blisters on the lips, mouth, nose, or throat, which eventually pop and form scabs Mouth and throat pain Swelling in the neck Fever, body aches, and feeling ill After the first time, pain and blisters can come back, but the other symptoms do not usually happen again. Plus, the symptoms are usually milder and don't last as long. When symptoms first occur, they last about 12 days. Later, if they come back, they can last 8 days or less. Treatment might be able to shorten how long the symptoms last. Some people can tell when their cold sores are going to come back. They feel pain, burning, tingling, or itching on their lips about a day before blisters form. People sometimes confuse cold sores with canker sores. But canker sores are different. Canker sores are painful red or white sores, but they do not usually form blisters or scab over. Canker sores can form in your mouth and on your tongue. Doctors do not know what causes canker sores   Can cold sores be prevented?-If you have frequent, painful outbreaks of cold sores, your doctor or nurse might give you an  antiviral medicine to take every day to prevent new symptoms. But most people do not need this kind of treatment. To help lower the chance of giving herpes to other people, when you have cold sores do not: Pryor Montes anyone Share utensils, glasses, water bottles, towels, lip balm, or razors Give anyone oral sex If you get cold sores, it is possible to spread herpes to others even when you don't have an "active" sore that you can see. But you are more likely to spread the infection when you do have an active sore.  How are cold sores treated?-Most people need treatment the first time they have cold sores. But people who have had cold sores before or who have mild symptoms do not always need treatment. For those who do need treatment, it can come in 2 forms: Some treatments help the body fight the herpes virus. These are called "antiviral medicines." They reduce symptoms and shorten the amount of time symptoms last. These medicines work best if they are started as soon as possible after symptoms begin. Some treatments treat just the pain and discomfort of cold sores without affecting the virus. These treatments include pain-relieving pills and gels that go on the mouth. Many of these treatments are sold without a prescription. Treatments can help ease the symptoms of cold sores, but no treatment can cure cold sores for good. Once you have the virus that causes cold sores, you will have it for the  rest of your life. That means that cold sores can keep coming back. Luckily, symptoms usually get milder with time.

## 2021-11-28 ENCOUNTER — Other Ambulatory Visit: Payer: Self-pay

## 2021-11-28 ENCOUNTER — Ambulatory Visit (INDEPENDENT_AMBULATORY_CARE_PROVIDER_SITE_OTHER): Payer: Self-pay | Admitting: Nurse Practitioner

## 2021-11-28 ENCOUNTER — Encounter: Payer: Self-pay | Admitting: Nurse Practitioner

## 2021-11-28 VITALS — BP 103/68 | HR 74 | Temp 98.2°F | Ht 61.0 in | Wt 138.0 lb

## 2021-11-28 DIAGNOSIS — N644 Mastodynia: Secondary | ICD-10-CM

## 2021-11-28 DIAGNOSIS — R102 Pelvic and perineal pain unspecified side: Secondary | ICD-10-CM

## 2021-11-28 DIAGNOSIS — N898 Other specified noninflammatory disorders of vagina: Secondary | ICD-10-CM

## 2021-11-28 DIAGNOSIS — R1084 Generalized abdominal pain: Secondary | ICD-10-CM

## 2021-11-28 LAB — POCT URINALYSIS DIP (CLINITEK)
Bilirubin, UA: NEGATIVE
Blood, UA: NEGATIVE
Glucose, UA: NEGATIVE mg/dL
Ketones, POC UA: NEGATIVE mg/dL
Leukocytes, UA: NEGATIVE
Nitrite, UA: NEGATIVE
POC PROTEIN,UA: NEGATIVE
Spec Grav, UA: 1.025 (ref 1.010–1.025)
Urobilinogen, UA: 0.2 E.U./dL
pH, UA: 7 (ref 5.0–8.0)

## 2021-11-28 MED ORDER — NAPROXEN 500 MG PO TABS: 500.0000 mg | ORAL_TABLET | Freq: Two times a day (BID) | ORAL | 0 refills | Status: AC

## 2021-11-28 NOTE — Patient Instructions (Addendum)
You were seen today in the [facility] for pelvic pain and right breast pain. Labs were collected, results will be available via MyChart or, if abnormal, you will be contacted by clinic staff. Please follow up in  3 wks for reevaluation of symptoms.

## 2021-11-28 NOTE — Progress Notes (Signed)
[facility] Patient [facility] [address] [place], [place]  [address] Phone:  [phone]   Fax:  [phone] Subjective:   Patient ID: Rebecca Schneider, female    DOB: 03-Jan-1986, 35 y.o.   MRN: [id number]  Chief Complaint  Patient presents with   Abdominal Pain    Low abdominal pain, 6 months. Very painful during intercourse. Feels like right side is swollen. Burning sensation while on cycle.  She has been trying to conceive for a couple months.    Breast Pain    Right side breast pain, burning sensation while laying down   HPI Rebecca Schneider 35 y.o. female  has a past medical history of Hypotension. To the [facility] for low abdominal pain x 3 mths.   Patient rates pain 5-4/10, increasing with ovulation and intercourse. Temporary improvement with anti inflammatory. Also endorses vaginal discharge, brown in color. Denies any vaginal odor. Has had one sexual partner in the past 6 mths, un protected. Denies taking any medications or birth control, has been attempting to conceive over the past several months. Has only been taking tylenol and motrin for pain.   Also concerned about burning pain in right breast x 1 mth. States that she has chronic cyst in right breast, which was evaluated a few years ago and determined to be benign. Was supposed to complete follow up evaluation of cyst, but was unable to due to financial constraints. Last mammogram 2-3 yrs ago. Cyst has not moved or changed in size since that time. Currently rates pain 3/10, but increases to 10/10 during menstrual cycle.   Denies any other complaints today. Denies any fever. Denies any fatigue, chest pain, shortness of breath, HA or dizziness. Denies any blurred vision, numbness or tingling.   Past Medical History:  Diagnosis Date   Hypotension    began after delivery of baby 11/01/14    No past surgical history on file.  Family History  Problem Relation Age of Onset   Cancer Brother        neck   Diabetes  Maternal Grandfather     Social History   Socioeconomic History   Marital status: Married    Spouse name: Not on file   Number of children: Not on file   Years of education: Not on file   Highest education level: Not on file  Occupational History   Not on file  Tobacco Use   Smoking status: Never   Smokeless tobacco: Never  Vaping Use   Vaping Use: Never used  Substance and Sexual Activity   Alcohol use: No   Drug use: No   Sexual activity: Yes    Birth control/protection: None  Other Topics Concern   Not on file  Social History Narrative   Not on file   Social Determinants of Health   Financial Resource Strain: Not on file  Food Insecurity: Not on file  Transportation Needs: Not on file  Physical Activity: Not on file  Stress: Not on file  Social Connections: Not on file  Intimate Partner Violence: Not on file    Outpatient Medications Prior to Visit  Medication Sig Dispense Refill   hydroquinone 4 % cream Apply topically 2 (two) times daily.     No facility-administered medications prior to visit.    Allergies  Allergen Reactions   Tylenol [Acetaminophen]     Review of Systems  Constitutional:  Negative for chills, fever and malaise/fatigue.  Respiratory:  Negative for cough and shortness of breath.  Cardiovascular:  Negative for chest pain, palpitations and leg swelling.  Gastrointestinal:  Negative for abdominal pain, blood in stool, constipation, diarrhea, nausea and vomiting.  Genitourinary:        See HPI  Skin: Negative.   Neurological: Negative.   Psychiatric/Behavioral:  Negative for depression. The patient is not nervous/anxious.   All other systems reviewed and are negative.     Objective:    Physical Exam Exam conducted with a chaperone present.  Constitutional:      General: She is not in acute distress.    Appearance: Normal appearance.  HENT:     Head: Normocephalic.  Cardiovascular:     Rate and Rhythm: Normal rate and  regular rhythm.     Pulses: Normal pulses.     Heart sounds: Normal heart sounds.     Comments: No obvious peripheral edema Pulmonary:     Effort: Pulmonary effort is normal.     Breath sounds: Normal breath sounds.  Chest:  Breasts:    Breasts are symmetrical.     Right: Mass and tenderness present. No swelling, bleeding, inverted nipple, nipple discharge or skin change.     Left: Normal.       Comments: 2-3 cm fixed cyst, tender to palpation Abdominal:     General: Abdomen is flat. Bowel sounds are normal.     Palpations: Abdomen is soft.  Genitourinary:    Comments: Right pelvis tender to palpation Musculoskeletal:     Cervical back: Normal range of motion and neck supple.  Skin:    General: Skin is warm and dry.     Capillary Refill: Capillary refill takes less than 2 seconds.  Neurological:     General: No focal deficit present.     Mental Status: She is alert and oriented to person, place, and time.  Psychiatric:        Mood and Affect: Mood normal.        Behavior: Behavior normal.        Thought Content: Thought content normal.        Judgment: Judgment normal.    BP 103/68    Pulse 74    Temp 98.2 F (36.8 C)    Ht 5' 1" (1.549 m)    Wt 138 lb 0.2 oz (62.6 kg)    LMP 10/30/2021    SpO2 100%    BMI 26.08 kg/m  Wt Readings from Last 3 Encounters:  11/28/21 138 lb 0.2 oz (62.6 kg)  05/28/21 139 lb 0.8 oz (63.1 kg)  11/02/20 140 lb (63.5 kg)    Immunization History  Administered Date(s) Administered   Influenza,inj,Quad PF,6+ Mos 08/22/2014   PFIZER(Purple Top)SARS-COV-2 Vaccination 10/05/2020, 11/02/2020   Tdap 08/22/2014    Diabetic Foot Exam - Simple   No data filed     No results found for: TSH Lab Results  Component Value Date   WBC 5.3 11/28/2021   HGB 14.5 11/28/2021   HCT 43.3 11/28/2021   MCV 90 11/28/2021   PLT 178 11/28/2021   Lab Results  Component Value Date   NA 140 11/28/2021   K 4.4 11/28/2021   CO2 21 11/28/2021   GLUCOSE 89  11/28/2021   BUN 17 11/28/2021   CREATININE 0.72 11/28/2021   BILITOT 0.5 11/28/2021   ALKPHOS 61 11/28/2021   AST 16 11/28/2021   ALT 12 11/28/2021   PROT 7.2 11/28/2021   ALBUMIN 4.5 11/28/2021   CALCIUM 9.3 11/28/2021   ANIONGAP 7 07/11/2017  EGFR 112 11/28/2021   No results found for: CHOL No results found for: HDL No results found for: LDLCALC No results found for: TRIG No results found for: CHOLHDL No results found for: HGBA1C     Assessment & Plan:   Problem List Items Addressed This Visit   None Visit Diagnoses     Pelvic pain    -  Primary   Relevant Orders   NuSwab Vaginitis Plus (VG+) (Completed)   POCT URINALYSIS DIP (CLINITEK) (Completed)   US Pelvic Complete With Transvaginal Informed to continue taking OTC medications as needed for pain Discussed non pharmacological methods for pain management   Vaginal discharge       Relevant Orders   NuSwab Vaginitis Plus (VG+) (Completed)   Breast pain, right       Relevant Medications   naproxen (NAPROSYN) 500 MG tablet   Other Relevant Orders   MM Digital Diagnostic Unilat R   Generalized abdominal pain       Relevant Orders   CBC with Differential/Platelet (Completed)   Comprehensive metabolic panel (Completed)   POCT URINALYSIS DIP (CLINITEK) (Completed)   Follow up in 2 wks for reevaluation of symptoms and additional management, sooner as needed    I am having Rebecca Schneider start on naproxen. I am also having her maintain her hydroquinone.  Meds ordered this encounter  Medications   naproxen (NAPROSYN) 500 MG tablet    Sig: Take 1 tablet (500 mg total) by mouth 2 (two) times daily with a meal for 14 days.    Dispense:  28 tablet    Refill:  0     Teena Dunk, NP

## 2021-11-29 LAB — COMPREHENSIVE METABOLIC PANEL
ALT: 12 IU/L (ref 0–32)
AST: 16 IU/L (ref 0–40)
Albumin/Globulin Ratio: 1.7 (ref 1.2–2.2)
Albumin: 4.5 g/dL (ref 3.8–4.8)
Alkaline Phosphatase: 61 IU/L (ref 44–121)
BUN/Creatinine Ratio: 24 — ABNORMAL HIGH (ref 9–23)
BUN: 17 mg/dL (ref 6–20)
Bilirubin Total: 0.5 mg/dL (ref 0.0–1.2)
CO2: 21 mmol/L (ref 20–29)
Calcium: 9.3 mg/dL (ref 8.7–10.2)
Chloride: 103 mmol/L (ref 96–106)
Creatinine, Ser: 0.72 mg/dL (ref 0.57–1.00)
Globulin, Total: 2.7 g/dL (ref 1.5–4.5)
Glucose: 89 mg/dL (ref 70–99)
Potassium: 4.4 mmol/L (ref 3.5–5.2)
Sodium: 140 mmol/L (ref 134–144)
Total Protein: 7.2 g/dL (ref 6.0–8.5)
eGFR: 112 mL/min/{1.73_m2} (ref >59)

## 2021-11-29 LAB — CBC WITH DIFFERENTIAL/PLATELET
Basophils Absolute: 0 10*3/uL (ref 0.0–0.2)
Basos: 0 %
EOS (ABSOLUTE): 0.1 10*3/uL (ref 0.0–0.4)
Eos: 2 %
Hematocrit: 43.3 % (ref 34.0–46.6)
Hemoglobin: 14.5 g/dL (ref 11.1–15.9)
Immature Grans (Abs): 0 10*3/uL (ref 0.0–0.1)
Immature Granulocytes: 0 %
Lymphocytes Absolute: 1.8 10*3/uL (ref 0.7–3.1)
Lymphs: 34 %
MCH: 30.1 pg (ref 26.6–33.0)
MCHC: 33.5 g/dL (ref 31.5–35.7)
MCV: 90 fL (ref 79–97)
Monocytes Absolute: 0.4 10*3/uL (ref 0.1–0.9)
Monocytes: 7 %
Neutrophils Absolute: 3.1 10*3/uL (ref 1.4–7.0)
Neutrophils: 57 %
Platelets: 178 10*3/uL (ref 150–450)
RBC: 4.82 x10E6/uL (ref 3.77–5.28)
RDW: 12.3 % (ref 11.7–15.4)
WBC: 5.3 10*3/uL (ref 3.4–10.8)

## 2021-11-30 NOTE — L&D Delivery Note (Signed)
OB/GYN Faculty Practice Delivery Note  Tony Friscia is a 36 y.o. [id number] s/p VD at [redacted]w[redacted]d. She was admitted for SROM.   ROM: 9h 51m with clear fluid GBS Status:  Negative/-- (11/16 1548)  Labor Progress: Initial SVE: 3/710/-2. She then progressed to complete.   Delivery Date/Time: 11/01/22 0758 Delivery: Called to room and patient was complete and pushing. Head delivered LOA. No nuchal cord present. Shoulder and body delivered in usual fashion. Infant with spontaneous cry, placed on mother's abdomen, dried and stimulated. Cord clamped x 2 after 1-minute delay, and cut by FOB. Cord blood drawn. Placenta delivered spontaneously with gentle cord traction. Fundus firm with massage and Pitocin. Labia, perineum, vagina, and cervix inspected with 1st degree perineal laceration, repaired in usual fashion.  Baby Weight: pending  Placenta: 3 vessel, intact. Sent to L&D Complications: None Lacerations: 1st degree EBL: 386 mL Analgesia: Epidural   Infant:  APGAR (1 MIN): 9   APGAR (5 MINS): 9    Myrtie Hawk, DO OB Family Medicine Fellow, [facility], [facility] 11/01/2022, 9:46 AM

## 2021-12-02 LAB — NUSWAB VAGINITIS PLUS (VG+)
Candida albicans, NAA: NEGATIVE
Candida glabrata, NAA: NEGATIVE
Chlamydia trachomatis, NAA: NEGATIVE
Neisseria gonorrhoeae, NAA: NEGATIVE
Trich vag by NAA: NEGATIVE

## 2021-12-04 ENCOUNTER — Ambulatory Visit: Payer: Self-pay | Attending: Family Medicine

## 2021-12-04 ENCOUNTER — Other Ambulatory Visit: Payer: Self-pay

## 2021-12-17 ENCOUNTER — Other Ambulatory Visit: Payer: Self-pay | Admitting: Nurse Practitioner

## 2021-12-17 DIAGNOSIS — N644 Mastodynia: Secondary | ICD-10-CM

## 2021-12-19 ENCOUNTER — Other Ambulatory Visit: Payer: Self-pay

## 2021-12-19 ENCOUNTER — Ambulatory Visit (INDEPENDENT_AMBULATORY_CARE_PROVIDER_SITE_OTHER): Payer: Self-pay | Admitting: Nurse Practitioner

## 2021-12-19 ENCOUNTER — Encounter: Payer: Self-pay | Admitting: Nurse Practitioner

## 2021-12-19 VITALS — BP 90/71 | HR 76 | Resp 16 | Wt 140.0 lb

## 2021-12-19 DIAGNOSIS — N644 Mastodynia: Secondary | ICD-10-CM

## 2021-12-19 DIAGNOSIS — R102 Pelvic and perineal pain: Secondary | ICD-10-CM

## 2021-12-19 DIAGNOSIS — L918 Other hypertrophic disorders of the skin: Secondary | ICD-10-CM

## 2021-12-19 NOTE — Progress Notes (Signed)
Pt states only bloating today, she also mentioned that she has the orange card and the place where she was supposed to get her US Pelvis does not take the orange

## 2021-12-19 NOTE — Patient Instructions (Signed)
You were seen today in the [facility] for reevaluation of pelvic and breast pain. Labs were collected, results will be available via MyChart or, if abnormal, you will be contacted by clinic staff.  Please follow up in 3 mths for reevaluation of pelvic and breast pain.

## 2021-12-19 NOTE — Progress Notes (Signed)
Integrated Behavioral Health Case Management Referral Note  12/19/2021 Name: Rebecca Schneider MRN: [id number] DOB: 10-06-1986 Rebecca Schneider is a 36 y.o. year old female who sees Passmore, Jake Church I, NP for primary care. LCSW was consulted to assess patient's needs and assist the patient with Financial Difficulties related to lack of health coverage .  Interpreter: No.   Interpreter Name & Language: none  Assessment: Patient experiencing financial difficulties related to lack of health coverage. Patient has already applied for the [facility].  Intervention: CSW met with patient during PCP visit. Patient in need of ultrasound, mammography, and dermatology referrals. She is uninsured. She started her [employer] application already. Confirmed with financial counselor that she needs to file her taxes for 2022 and provide that information with her application, due to her employment situation. Will remain available to assist with follow up on this.  Provided patient with [facility] mammography scholarship application and instructions.  Review of patient status, including review of consultants reports, relevant laboratory and other test results, and collaboration with appropriate care team members and the patient's provider was performed as part of comprehensive patient evaluation and provision of services.    Estanislado Emms, [facility] [phone]

## 2021-12-19 NOTE — Progress Notes (Signed)
Rebecca Schneider, [name] Phone:  [phone]   Fax:  [phone] Subjective:   Patient ID: Rebecca Schneider, female    DOB: 1986-11-29, 35 y.o.   MRN: [id number]  No chief complaint on file.  HPI Rebecca Schneider 36 y.o. female  has a past medical history of Hypotension. To the [facility] for reevaluation of breast and pelvic pain.   Patient states that cyst to right breast has not increased in size since previous visit. Pain in breast has improved, but is still severe at times. Will call today for mammogram appointment, for further evaluation of breast cyst.   Pelvic pain remains unchanged, continues to have discomfort. States that she was unable to receive pelvic ultrasound, facility did not accept her insurance. Informed to apply for [facility] card, but was not given additional information on the application process.   Has been used prescribed Naproxen for breast and pelvic pain with moderate improvement in pain.   Patient also concerned about generalized rash that began in neck and spread to other areas. Suspected she had warts and has been using wart cream with no improvement in symptoms. States that they appear like skin tags and are getting larger, began a year ago with cream being affective, but it is no longer affective. Denies any itching, but discomfort and a burning sensation.   Denies any other complaints today. Denies any fatigue, chest pain, shortness of breath, HA or dizziness. Denies any blurred vision, numbness or tingling.   Past Medical History:  Diagnosis Date   Hypotension    began after delivery of baby 11/01/14    No past surgical history on file.  Family History  Problem Relation Age of Onset   Cancer Brother        neck   Diabetes Maternal Grandfather     Social History   Socioeconomic History   Marital status: Married    Spouse name: Not on file   Number of children: Not on file   Years  of education: Not on file   Highest education level: Not on file  Occupational History   Not on file  Tobacco Use   Smoking status: Never   Smokeless tobacco: Never  Vaping Use   Vaping Use: Never used  Substance and Sexual Activity   Alcohol use: No   Drug use: No   Sexual activity: Yes    Birth control/protection: None  Other Topics Concern   Not on file  Social History Narrative   Not on file   Social Determinants of Health   Financial Resource Strain: Not on file  Food Insecurity: Not on file  Transportation Needs: Not on file  Physical Activity: Not on file  Stress: Not on file  Social Connections: Not on file  Intimate Partner Violence: Not on file    Outpatient Medications Prior to Visit  Medication Sig Dispense Refill   hydroquinone 4 % cream Apply topically 2 (two) times daily. (Patient not taking: Reported on 12/19/2021)     No facility-administered medications prior to visit.    Allergies  Allergen Reactions   Tylenol [Acetaminophen]     Review of Systems  Constitutional:  Negative for chills, fever and malaise/fatigue.  Respiratory:  Negative for cough and shortness of breath.   Cardiovascular:  Negative for chest pain, palpitations and leg swelling.  Gastrointestinal:  Negative for abdominal pain, blood in stool, constipation, diarrhea, nausea and vomiting.  Genitourinary:  See HPI  Skin: Negative.        See HPI  Neurological: Negative.   Psychiatric/Behavioral:  Negative for depression. The patient is not nervous/anxious.   All other systems reviewed and are negative.     Objective:    Physical Exam Vitals reviewed.  Constitutional:      General: She is not in acute distress.    Appearance: Normal appearance. She is normal weight.  HENT:     Head: Normocephalic.  Cardiovascular:     Rate and Rhythm: Normal rate and regular rhythm.     Pulses: Normal pulses.     Heart sounds: Normal heart sounds.     Comments: No obvious  peripheral edema Pulmonary:     Effort: Pulmonary effort is normal.     Breath sounds: Normal breath sounds.  Skin:    General: Skin is warm and dry.     Capillary Refill: Capillary refill takes less than 2 seconds.     Findings: Lesion present.  Neurological:     General: No focal deficit present.     Mental Status: She is alert and oriented to person, place, and time.  Psychiatric:        Mood and Affect: Mood normal.        Behavior: Behavior normal.        Thought Content: Thought content normal.        Judgment: Judgment normal.    BP 90/71    Pulse 76    Resp 16    Wt 140 lb (63.5 kg)    SpO2 100%    BMI 26.45 kg/m  Wt Readings from Last 3 Encounters:  12/19/21 140 lb (63.5 kg)  11/28/21 138 lb 0.2 oz (62.6 kg)  05/28/21 139 lb 0.8 oz (63.1 kg)    Immunization History  Administered Date(s) Administered   Influenza,inj,Quad PF,6+ Mos 08/22/2014   PFIZER(Purple Top)SARS-COV-2 Vaccination 10/05/2020, 11/02/2020   Tdap 08/22/2014    Diabetic Foot Exam - Simple   No data filed     No results found for: TSH Lab Results  Component Value Date   WBC 5.3 11/28/2021   HGB 14.5 11/28/2021   HCT 43.3 11/28/2021   MCV 90 11/28/2021   PLT 178 11/28/2021   Lab Results  Component Value Date   NA 140 11/28/2021   K 4.4 11/28/2021   CO2 21 11/28/2021   GLUCOSE 89 11/28/2021   BUN 17 11/28/2021   CREATININE 0.72 11/28/2021   BILITOT 0.5 11/28/2021   ALKPHOS 61 11/28/2021   AST 16 11/28/2021   ALT 12 11/28/2021   PROT 7.2 11/28/2021   ALBUMIN 4.5 11/28/2021   CALCIUM 9.3 11/28/2021   ANIONGAP 7 07/11/2017   EGFR 112 11/28/2021   No results found for: CHOL No results found for: HDL No results found for: LDLCALC No results found for: TRIG No results found for: CHOLHDL No results found for: HGBA1C     Assessment & Plan:   Problem List Items Addressed This Visit   None Visit Diagnoses     Pelvic pain    -  Primary Informed to continue using prescribed  medications as needed for pain Informed to take OTC medications as needed Received financial counseling from in clinic LCSW   Breast pain, right     Informed to schedule mammogram  Continue monitoring at home for changes in size  Continue completing SBE regularly   Cutaneous skin tags       Relevant Orders  Ambulatory referral to Dermatology Continue using OTC medications as needed for discomfort   Follow up in 3 mths for reevaluation of breast and pelvic pain, sooner as need    I am having Tommy Medal maintain her hydroquinone.  No orders of the defined types were placed in this encounter.    Teena Dunk, NP

## 2022-01-05 ENCOUNTER — Other Ambulatory Visit: Payer: Self-pay

## 2022-01-05 DIAGNOSIS — N644 Mastodynia: Secondary | ICD-10-CM

## 2022-01-08 ENCOUNTER — Telehealth: Payer: Self-pay | Admitting: Clinical

## 2022-01-08 NOTE — Telephone Encounter (Signed)
Integrated Behavioral Health General Follow Up Note  01/08/2022 Name: Nikelle Malatesta MRN: [id number] DOB: Jun 04, 1986 Brae Schaafsma is a 36 y.o. year old female who sees Passmore, Enid Derry I, NP for primary care. LCSW was consulted to assess patient's needs and assist the patient with Financial Difficulties related to lack of health coverage .  Interpreter: Yes.     Interpreter Name & Language: Spanish Asher Muir [phone])  Assessment: Patient experiencing financial difficulties related to lack of health coverage.  Ongoing Intervention: Today CSW called patient with interpreter to follow up. Patient called the clinic earlier this week with questions about scheduling her ultrasound. She indicated the financial counselor advised she can get an ultrasound and be billed and submit that bill with [employer] application. Patient is not sure how to check if this is possible.  Patient also reported she turned in the mammogram scholarship form, but did not receive a call back about scheduling. Checked patient's appointments and advised her the appointment is scheduled for 01/29/22.  CSW to follow up and coordinate with PCP on ultrasound needs and determine how best to move forward with patient. Her [employer] application has not been processed yet as she needs to submit tax information first.   Review of patient status, including review of consultants reports, relevant laboratory and other test results, and collaboration with appropriate care team members and the patient's provider was performed as part of comprehensive patient evaluation and provision of services.    Abigail Butts, LCSW [facility] [phone]

## 2022-01-09 ENCOUNTER — Telehealth: Payer: Self-pay | Admitting: Clinical

## 2022-01-09 NOTE — Telephone Encounter (Signed)
Integrated Behavioral Health General Follow Up Note  01/09/2022 Name: Rebecca Schneider MRN: [id number] DOB: 11/08/1986 Rebecca Schneider is a 36 y.o. year old female who sees Passmore, Jake Church I, NP for primary care. LCSW was consulted to assess patient's needs and assist the patient with Financial Difficulties related to lack of health coverage .  Interpreter: Yes.     Interpreter Name & Language: Spanish Rebecca Schneider [phone])  Assessment: Patient experiencing financial difficulties related to lack of health coverage.  Ongoing Intervention: Today CSW called and updated patient on issue of getting her pelvic ultrasound completed. Coordinating with Patient Robeline [facility]) CMA on this.  Review of patient status, including review of consultants reports, relevant laboratory and other test results, and collaboration with appropriate care team members and the patient's provider was performed as part of comprehensive patient evaluation and provision of services.    Rebecca Schneider, [facility] [phone]

## 2022-01-19 ENCOUNTER — Telehealth: Payer: Self-pay | Admitting: Clinical

## 2022-01-19 NOTE — Telephone Encounter (Signed)
Integrated Behavioral Health General Follow Up Note  01/19/2022 Name: Luiza Powley MRN: [id number] DOB: 02-05-1986 Skilyn Leventry is a 36 y.o. year old female who sees Passmore, Jake Church I, NP for primary care. LCSW was consulted to assess patient's needs and assist the patient with Financial Difficulties related to lack of health coverage .  Interpreter: Yes.     Interpreter Name & Language: Spanish [phone])  Assessment: Patient experiencing financial difficulties related to lack of health coverage.  Ongoing Intervention: CSW following as patient had difficulty scheduling ultrasound due to insurance problems. Called patient today with interpreter and advised her that pelvic ultrasound is scheduled. Patient is aware of the appointment.   Review of patient status, including review of consultants reports, relevant laboratory and other test results, and collaboration with appropriate care team members and the patient's provider was performed as part of comprehensive patient evaluation and provision of services.    Estanislado Emms, [facility] [phone]

## 2022-01-22 ENCOUNTER — Other Ambulatory Visit: Payer: Self-pay

## 2022-01-22 ENCOUNTER — Ambulatory Visit (HOSPITAL_COMMUNITY)
Admission: RE | Admit: 2022-01-22 | Discharge: 2022-01-22 | Disposition: A | Discharge status: Home / Self Care | Payer: Self-pay | Source: Ambulatory Visit | Attending: Nurse Practitioner | Admitting: Nurse Practitioner

## 2022-01-22 DIAGNOSIS — R102 Pelvic and perineal pain: Secondary | ICD-10-CM | POA: Insufficient documentation

## 2022-01-29 ENCOUNTER — Other Ambulatory Visit: Payer: Self-pay

## 2022-01-29 ENCOUNTER — Ambulatory Visit
Admission: RE | Admit: 2022-01-29 | Discharge: 2022-01-29 | Disposition: A | Discharge status: Home / Self Care | Payer: No Typology Code available for payment source | Source: Ambulatory Visit | Attending: Obstetrics and Gynecology | Admitting: Obstetrics and Gynecology

## 2022-01-29 ENCOUNTER — Ambulatory Visit: Payer: Self-pay | Admitting: *Deleted

## 2022-01-29 ENCOUNTER — Ambulatory Visit: Payer: No Typology Code available for payment source

## 2022-01-29 VITALS — BP 108/72 | Wt 138.4 lb

## 2022-01-29 DIAGNOSIS — N644 Mastodynia: Secondary | ICD-10-CM

## 2022-01-29 DIAGNOSIS — Z1239 Encounter for other screening for malignant neoplasm of breast: Secondary | ICD-10-CM

## 2022-01-29 NOTE — Progress Notes (Signed)
Ms. Larin Depaoli is a 36 y.o. female who presents to [facility] clinic today with complaint of right outer breast pain x 6 months that comes and goes.    Pap Smear: Pap smear not completed today. Last Pap smear was 05/28/2021 at the [facility] and was normal. Per patient has no history of an abnormal Pap smear. Last Pap smear result is available in Epic.   Physical exam: Breasts Breasts symmetrical. No skin abnormalities bilateral breasts. No nipple retraction bilateral breasts. No nipple discharge bilateral breasts. No lymphadenopathy. No lumps palpated bilateral breasts. Complaints of diffuse right breast pain on exam.     MM DIAG BREAST TOMO UNI RIGHT  Result Date: 12/03/2017 CLINICAL DATA:  36 year old female presenting for six-month follow-up of right probable right breast cyst cluster. EXAM: 2D DIGITAL DIAGNOSTIC RIGHT MAMMOGRAM WITH CAD AND ADJUNCT TOMO ULTRASOUND RIGHT BREAST COMPARISON:  Previous exam(s). ACR Breast Density Category b: There are scattered areas of fibroglandular density. FINDINGS: A circumscribed mass is again identified in the central right breast at middle depth. The appearance is slightly more prominent compared to prior examinations. No additional suspicious mammographic findings are identified. Mammographic images were processed with CAD. Targeted ultrasound is performed, showing an oval, circumscribed hypoechoic mass at the 12:30 position 1 cm from the nipple. It now measures 0.4 x 0.4 x 0.2 cm (previously 0.6 x 0.6 x 0.5 cm). This may correspond with the mammographic findings. IMPRESSION: Probably benign right breast mass. Recommendation is for continued six-month follow-up. RECOMMENDATION: Bilateral diagnostic mammogram and right breast ultrasound in 6 months. I have discussed the findings and recommendations with the patient. Results were also provided in writing at the conclusion of the visit. If applicable, a reminder letter will be sent to the  patient regarding the next appointment. BI-RADS CATEGORY  3: Probably benign. Electronically Signed   By: Sande Brothers M.D.   On: 12/03/2017 14:00   MM DIAG BREAST TOMO BILATERAL  Result Date: 07/14/2018 CLINICAL DATA:  36 year old female for follow-up of RIGHT breast cystic lesion. EXAM: DIGITAL DIAGNOSTIC BILATERAL MAMMOGRAM WITH CAD AND TOMO ULTRASOUND RIGHT BREAST COMPARISON:  Previous exam(s). ACR Breast Density Category b: There are scattered areas of fibroglandular density. FINDINGS: 2D/3D full field views of both breasts demonstrate no suspicious mass, distortion or worrisome calcifications. Mammographic images were processed with CAD. Targeted ultrasound is performed, showing a stable 4 x 2 x 3 mm benign complicated cyst at the 12:30 position of the RIGHT breast 1 cm from the nipple IMPRESSION: 1. Stable benign cyst within the UPPER RIGHT breast. No further imaging follow-up recommended. 2. No mammographic evidence of breast malignancy. RECOMMENDATION: Bilateral screening mammograms in 1 year. I have discussed the findings and recommendations with the patient. Results were also provided in writing at the conclusion of the visit. If applicable, a reminder letter will be sent to the patient regarding the next appointment. BI-RADS CATEGORY  2: Benign. Electronically Signed   By: Harmon Pier M.D.   On: 07/14/2018 15:40   MM DIAG BREAST TOMO BILATERAL  Result Date: 05/27/2017 CLINICAL DATA:  Skin redness/inflammation at the 1 o'clock axis, recurrent, similar findings last year which resulted in an incision and drainage with resolution. Also, follow-up for probably benign calcifications identified within the upper left breast on diagnostic mammogram 05/21/2016. EXAM: 2D DIGITAL DIAGNOSTIC BILATERAL MAMMOGRAM WITH CAD AND ADJUNCT TOMO ULTRASOUND BILATERAL BREAST COMPARISON:  Previous exam(s). ACR Breast Density Category b: There are scattered areas of fibroglandular density. FINDINGS: Bilateral  2D CC and  MLO projections were obtained today, with additional 3D tomosynthesis, and with additional spot compression view of the upper left breast corresponding to the area of clinical concern, with overlying skin marker in place. Left breast: There are no dominant masses, suspicious calcifications or secondary signs of malignancy within the left breast. Specifically, there is no mammographic abnormality within the upper-outer quadrant of the left breast corresponding to the area of clinical concern. In addition, the previously described calcifications within the upper left breast, presumed fat necrosis, have resolved confirming benignity. Right breast: There is a new oval circumscribed mass within the slightly inner right breast, measuring approximately 7 mm, tomosynthesis CC slice 33. No additional masses, suspicious calcifications or secondary signs of malignancy within the right breast. Mammographic images were processed with CAD. Left breast: Targeted ultrasound is performed, showing a small sebaceous cyst within the skin of the left breast at the 12:30 o'clock axis, 5 cm from the nipple, measuring 7 x 2 mm. The underlying breast tissues are normal. Right breast: Targeted ultrasound is performed, showing a probably benign cluster of cysts at the 12:30 o'clock axis, 1 cm from the nipple, measuring 7 x 5 x 6 mm, avascular, corresponding to the mammographic finding. IMPRESSION: 1. Probably benign cluster of cysts within the RIGHT breast at the 12:30 o'clock axis, 1 cm from the nipple, measuring 7 mm, corresponding to the mammographic finding. Recommend follow-up RIGHT breast diagnostic mammogram, and possible ultrasound, in 6 months to ensure stability. 2. No evidence of malignancy within the LEFT breast. Benign sebaceous cyst within the skin of the left breast at the 12:30 o'clock axis, measuring 7 mm, corresponding to the area of clinical concern. RECOMMENDATION: 1. RIGHT breast diagnostic mammogram, and possible  ultrasound, in 6 months. 2. Patient was reassured that sebaceous cysts are typically self-limiting. Patient was encouraged to apply warm compresses to the site (left breast) to accelerate resolution. I have discussed the findings and recommendations with the patient. Results were also provided in writing at the conclusion of the visit. If applicable, a reminder letter will be sent to the patient regarding the next appointment. BI-RADS CATEGORY  3: Probably benign. Electronically Signed   By: Bary Richard M.D.   On: 05/27/2017 16:28   MS DIGITAL SCREENING TOMO BILATERAL  Result Date: 08/02/2019 CLINICAL DATA:  Screening. EXAM: DIGITAL SCREENING BILATERAL MAMMOGRAM WITH TOMO AND CAD COMPARISON:  Previous exam(s). ACR Breast Density Category b: There are scattered areas of fibroglandular density. FINDINGS: There are no findings suspicious for malignancy. Images were processed with CAD. IMPRESSION: No mammographic evidence of malignancy. A result letter of this screening mammogram will be mailed directly to the patient. RECOMMENDATION: Screening mammogram at age 35. (Code:SM-B-40A) BI-RADS CATEGORY  1: Negative. Electronically Signed   By: Britta Mccreedy M.D.   On: 08/02/2019 12:40     Pelvic/Bimanual Pap is not indicated today per BCCCP guidelines.   Smoking History: Patient has never smoked.   Patient Navigation: Patient education provided. Access to services provided for patient through [place] program. Spanish interpreter Alene Mires from [facility] provided.    Breast and Cervical Cancer Risk Assessment: Patient does not have family history of breast cancer, known genetic mutations, or radiation treatment to the chest before age 70. Patient does not have history of cervical dysplasia, immunocompromised, or DES exposure in-utero.  Risk Scores as of 01/29/2022     Dondra Spry           5-year 0.2 %   Lifetime 5.9 %  Last calculated by Meryl Dare, CMA on 01/29/2022 at  8:54 AM        A: BCCCP exam without pap smear Complaint of right outer breast pain.  P: Referred patient to the [facility] for a diagnostic mammogram. Appointment scheduled Thursday, January 29, 2022 at 1030.  Priscille Heidelberg, RN 01/29/2022 9:01 AM

## 2022-01-29 NOTE — Patient Instructions (Signed)
Explained breast self awareness with Silva Bandy. Patient did not need a Pap smear today due to last Pap smear was 05/28/2021. Let her know BCCCP will cover Pap smears every 3 years unless has a history of abnormal Pap smears. Referred patient to the [facility] for a diagnostic mammogram. Appointment scheduled Thursday, January 29, 2022 at 1030. Patient aware of appointment and will be there. Silva Bandy verbalized understanding. ? ?Lynton Crescenzo, Kathaleen Maser, RN ?9:01 AM ? ? ? ? ?

## 2022-03-20 ENCOUNTER — Ambulatory Visit: Payer: No Typology Code available for payment source | Admitting: Nurse Practitioner

## 2022-04-01 ENCOUNTER — Ambulatory Visit (HOSPITAL_COMMUNITY)
Admission: EM | Admit: 2022-04-01 | Discharge: 2022-04-01 | Disposition: A | Discharge status: Home / Self Care | Payer: No Typology Code available for payment source | Attending: Family Medicine | Admitting: Family Medicine

## 2022-04-01 ENCOUNTER — Inpatient Hospital Stay (HOSPITAL_COMMUNITY)
Admission: AD | Admit: 2022-04-01 | Discharge: 2022-04-01 | Disposition: A | Discharge status: Home / Self Care | Payer: No Typology Code available for payment source | Attending: Obstetrics & Gynecology | Admitting: Obstetrics & Gynecology

## 2022-04-01 ENCOUNTER — Encounter (HOSPITAL_COMMUNITY): Payer: Self-pay | Admitting: Obstetrics & Gynecology

## 2022-04-01 ENCOUNTER — Encounter (HOSPITAL_COMMUNITY): Payer: Self-pay

## 2022-04-01 ENCOUNTER — Inpatient Hospital Stay (HOSPITAL_COMMUNITY): Payer: No Typology Code available for payment source

## 2022-04-01 DIAGNOSIS — R109 Unspecified abdominal pain: Secondary | ICD-10-CM

## 2022-04-01 DIAGNOSIS — R1032 Left lower quadrant pain: Secondary | ICD-10-CM

## 2022-04-01 DIAGNOSIS — O23591 Infection of other part of genital tract in pregnancy, first trimester: Secondary | ICD-10-CM | POA: Insufficient documentation

## 2022-04-01 DIAGNOSIS — R1030 Lower abdominal pain, unspecified: Secondary | ICD-10-CM | POA: Insufficient documentation

## 2022-04-01 DIAGNOSIS — N939 Abnormal uterine and vaginal bleeding, unspecified: Secondary | ICD-10-CM

## 2022-04-01 DIAGNOSIS — O26891 Other specified pregnancy related conditions, first trimester: Secondary | ICD-10-CM

## 2022-04-01 DIAGNOSIS — Z3A08 8 weeks gestation of pregnancy: Secondary | ICD-10-CM | POA: Insufficient documentation

## 2022-04-01 DIAGNOSIS — Z3201 Encounter for pregnancy test, result positive: Secondary | ICD-10-CM

## 2022-04-01 DIAGNOSIS — O26899 Other specified pregnancy related conditions, unspecified trimester: Secondary | ICD-10-CM

## 2022-04-01 DIAGNOSIS — B9689 Other specified bacterial agents as the cause of diseases classified elsewhere: Secondary | ICD-10-CM | POA: Insufficient documentation

## 2022-04-01 DIAGNOSIS — O209 Hemorrhage in early pregnancy, unspecified: Secondary | ICD-10-CM | POA: Insufficient documentation

## 2022-04-01 LAB — POCT URINALYSIS DIPSTICK, ED / UC
Bilirubin Urine: NEGATIVE
Glucose, UA: NEGATIVE mg/dL
Ketones, ur: NEGATIVE mg/dL
Leukocytes,Ua: NEGATIVE
Nitrite: NEGATIVE
Protein, ur: NEGATIVE mg/dL
Specific Gravity, Urine: 1.01 (ref 1.005–1.030)
Urobilinogen, UA: 0.2 mg/dL (ref 0.0–1.0)
pH: 5.5 (ref 5.0–8.0)

## 2022-04-01 LAB — COMPREHENSIVE METABOLIC PANEL
ALT: 14 U/L (ref 0–44)
AST: 16 U/L (ref 15–41)
Albumin: 3.6 g/dL (ref 3.5–5.0)
Alkaline Phosphatase: 35 U/L — ABNORMAL LOW (ref 38–126)
Anion gap: 8 (ref 5–15)
BUN: 13 mg/dL (ref 6–20)
CO2: 24 mmol/L (ref 22–32)
Calcium: 9.5 mg/dL (ref 8.9–10.3)
Chloride: 104 mmol/L (ref 98–111)
Creatinine, Ser: 0.61 mg/dL (ref 0.44–1.00)
GFR, Estimated: >60 mL/min (ref >60)
Glucose, Bld: 87 mg/dL (ref 70–99)
Potassium: 3.6 mmol/L (ref 3.5–5.1)
Sodium: 136 mmol/L (ref 135–145)
Total Bilirubin: 0.5 mg/dL (ref 0.3–1.2)
Total Protein: 6.9 g/dL (ref 6.5–8.1)

## 2022-04-01 LAB — CBC WITH DIFFERENTIAL/PLATELET
Abs Immature Granulocytes: 0.02 10*3/uL (ref 0.00–0.07)
Basophils Absolute: 0 10*3/uL (ref 0.0–0.1)
Basophils Relative: 0 %
Eosinophils Absolute: 0.1 10*3/uL (ref 0.0–0.5)
Eosinophils Relative: 1 %
HCT: 38.4 % (ref 36.0–46.0)
Hemoglobin: 13.1 g/dL (ref 12.0–15.0)
Immature Granulocytes: 0 %
Lymphocytes Relative: 29 %
Lymphs Abs: 2.3 10*3/uL (ref 0.7–4.0)
MCH: 30.5 pg (ref 26.0–34.0)
MCHC: 34.1 g/dL (ref 30.0–36.0)
MCV: 89.5 fL (ref 80.0–100.0)
Monocytes Absolute: 0.6 10*3/uL (ref 0.1–1.0)
Monocytes Relative: 7 %
Neutro Abs: 5.2 10*3/uL (ref 1.7–7.7)
Neutrophils Relative %: 63 %
Platelets: 185 10*3/uL (ref 150–400)
RBC: 4.29 MIL/uL (ref 3.87–5.11)
RDW: 12.5 % (ref 11.5–15.5)
WBC: 8.2 10*3/uL (ref 4.0–10.5)
nRBC: 0 % (ref 0.0–0.2)

## 2022-04-01 LAB — HCG, QUANTITATIVE, PREGNANCY: hCG, Beta Chain, Quant, S: 189997 m[IU]/mL — ABNORMAL HIGH (ref <5)

## 2022-04-01 LAB — POC URINE PREG, ED: Preg Test, Ur: POSITIVE — AB

## 2022-04-01 LAB — WET PREP, GENITAL
Sperm: NONE SEEN
Trich, Wet Prep: NONE SEEN
WBC, Wet Prep HPF POC: >=10 — AB (ref <10)
Yeast Wet Prep HPF POC: NONE SEEN

## 2022-04-01 MED ORDER — METRONIDAZOLE 500 MG PO TABS: 500.0000 mg | ORAL_TABLET | Freq: Two times a day (BID) | ORAL | 0 refills | Status: AC

## 2022-04-01 NOTE — MAU Note (Signed)
Rebecca Schneider is a 36 y.o.  here in MAU reporting: seen earlier today at [facility], +UPT. Urine sent.  Pt reports pain in lower abd, small amt of brownish d/c from vagina ?LMP: 3/3 ?Onset of complaint: 4 wks ?Pain score: 2 ?Vitals:  ? 04/01/22 1455  ?BP: (!) 107/59  ?Pulse: (!) 59  ?Resp: 16  ?Temp: 98.2 ?F (36.8 ?C)  ?SpO2: 100%  ?   ?Lab orders placed from triage:  none ?

## 2022-04-01 NOTE — ED Triage Notes (Signed)
Pt states she has irregular periods and has been having lower abdominal pain. Pt states she feels fatigue and took a pregnancy home test and came back positive. ?

## 2022-04-01 NOTE — MAU Provider Note (Signed)
?History  ?  ? ?CSN: [id number] ? ?Arrival date and time: 04/01/22 1447 ? ? Event Date/Time  ? First Provider Initiated Contact with Patient 04/01/22 1546   ?  ? ?Chief Complaint  ?Patient presents with  ? Vaginal Bleeding  ? Abdominal Pain  ? ?HPI ? ?Rebecca Schneider is a 36 y.o. female G3P2002 @ [redacted]w[redacted]d here with brown vaginal discharge. She feels like her period is going to start. The brown discharge started 3 days ago. She reports mild lower abdominal pain. She reports the pain 3/10. The pain comes and goes all day and night. She tried advil which did help her pain.  ? ?OB History   ? ? Gravida  ?3  ? Para  ?2  ? Term  ?2  ? Preterm  ?   ? AB  ?   ? Living  ?2  ?  ? ? SAB  ?   ? IAB  ?   ? Ectopic  ?   ? Multiple  ?0  ? Live Births  ?2  ?   ?  ?  ? ? ?Past Medical History:  ?Diagnosis Date  ? Hypotension   ? began after delivery of baby 11/01/14  ? ? ?History reviewed. No pertinent surgical history. ? ?Family History  ?Problem Relation Age of Onset  ? Cancer Brother   ?     neck  ? Diabetes Maternal Grandfather   ? ? ?Social History  ? ?Tobacco Use  ? Smoking status: Never  ? Smokeless tobacco: Never  ?Vaping Use  ? Vaping Use: Never used  ?Substance Use Topics  ? Alcohol use: No  ? Drug use: No  ? ? ?Allergies:  ?Allergies  ?Allergen Reactions  ? Tylenol [Acetaminophen]   ? ? ?Medications Prior to Admission  ?Medication Sig Dispense Refill Last Dose  ? hydroquinone 4 % cream Apply topically 2 (two) times daily. (Patient not taking: Reported on 12/19/2021)     ? ?Results for orders placed or performed during the hospital encounter of 04/01/22 (from the past 48 hour(s))  ?CBC with Differential/Platelet     Status: None  ? Collection Time: 04/01/22  4:03 PM  ?Result Value Ref Range  ? WBC 8.2 4.0 - 10.5 K/uL  ? RBC 4.29 3.87 - 5.11 MIL/uL  ? Hemoglobin 13.1 12.0 - 15.0 g/dL  ? HCT 38.4 36.0 - 46.0 %  ? MCV 89.5 80.0 - 100.0 fL  ? MCH 30.5 26.0 - 34.0 pg  ? MCHC 34.1 30.0 - 36.0 g/dL  ? RDW 12.5 11.5 - 15.5 %  ?  Platelets 185 150 - 400 K/uL  ? nRBC 0.0 0.0 - 0.2 %  ? Neutrophils Relative % 63 %  ? Neutro Abs 5.2 1.7 - 7.7 K/uL  ? Lymphocytes Relative 29 %  ? Lymphs Abs 2.3 0.7 - 4.0 K/uL  ? Monocytes Relative 7 %  ? Monocytes Absolute 0.6 0.1 - 1.0 K/uL  ? Eosinophils Relative 1 %  ? Eosinophils Absolute 0.1 0.0 - 0.5 K/uL  ? Basophils Relative 0 %  ? Basophils Absolute 0.0 0.0 - 0.1 K/uL  ? Immature Granulocytes 0 %  ? Abs Immature Granulocytes 0.02 0.00 - 0.07 K/uL  ?  Comment: Performed at [facility], [address] [place], [place] [address]  ?Comprehensive metabolic panel     Status: Abnormal  ? Collection Time: 04/01/22  4:03 PM  ?Result Value Ref Range  ? Sodium 136 135 - 145 mmol/L  ?  Potassium 3.6 3.5 - 5.1 mmol/L  ? Chloride 104 98 - 111 mmol/L  ? CO2 24 22 - 32 mmol/L  ? Glucose, Bld 87 70 - 99 mg/dL  ?  Comment: Glucose reference range applies only to samples taken after fasting for at least 8 hours.  ? BUN 13 6 - 20 mg/dL  ? Creatinine, Ser 0.61 0.44 - 1.00 mg/dL  ? Calcium 9.5 8.9 - 10.3 mg/dL  ? Total Protein 6.9 6.5 - 8.1 g/dL  ? Albumin 3.6 3.5 - 5.0 g/dL  ? AST 16 15 - 41 U/L  ? ALT 14 0 - 44 U/L  ? Alkaline Phosphatase 35 (L) 38 - 126 U/L  ? Total Bilirubin 0.5 0.3 - 1.2 mg/dL  ? GFR, Estimated >60 >60 mL/min  ?  Comment: (NOTE) ?Calculated using the CKD-EPI Creatinine Equation (2021) ?  ? Anion gap 8 5 - 15  ?  Comment: Performed at [facility], [address], [place], [place] [address]  ?hCG, quantitative, pregnancy     Status: Abnormal  ? Collection Time: 04/01/22  4:03 PM  ?Result Value Ref Range  ? hCG, Beta Chain, Quant, S 189,997 (H) <5 mIU/mL  ?  Comment:        ?  GEST. AGE      CONC.  (mIU/mL) ?  <=1 WEEK        5 - 50 ?    2 WEEKS       50 - 500 ?    3 WEEKS       100 - 10,000 ?    4 WEEKS     1,000 - 30,000 ?    5 WEEKS     3,500 - 115,000 ?  6-8 WEEKS     12,000 - 270,000 ?   12 WEEKS     15,000 - 220,000 ?       ?FEMALE AND NON-PREGNANT FEMALE: ?    LESS THAN 5  mIU/mL ?Performed at [facility], [address] [place], [place] [address] ?  ?Wet prep, genital     Status: Abnormal  ? Collection Time: 04/01/22  5:22 PM  ? Specimen: Vaginal  ?Result Value Ref Range  ? Yeast Wet Prep HPF POC NONE SEEN NONE SEEN  ? Trich, Wet Prep NONE SEEN NONE SEEN  ? Clue Cells Wet Prep HPF POC PRESENT (A) NONE SEEN  ? WBC, Wet Prep HPF POC >=10 (A) <10  ? Sperm NONE SEEN   ?  Comment: Performed at [facility], [address] [place], [place] [address]  ?  ?Review of Systems  ?Constitutional:  Negative for fever.  ?Gastrointestinal:  Positive for abdominal pain.  ?Genitourinary:  Positive for vaginal bleeding and vaginal discharge.  ?Physical Exam  ? ?Blood pressure (!) 107/59, pulse (!) 59, temperature 98.2 ?F (36.8 ?C), temperature source Oral, resp. rate 16, height 5' 2.99" (1.6 m), weight 61 kg, last menstrual period 01/30/2022, SpO2 100 %. ? ?Physical Exam ?Constitutional:   ?   General: She is not in acute distress. ?   Appearance: She is well-developed. She is not ill-appearing, toxic-appearing or diaphoretic.  ?Abdominal:  ?   Palpations: Abdomen is soft.  ?   Tenderness: There is no abdominal tenderness. There is no guarding or rebound.  ?Skin: ?   General: Skin is warm.  ?Neurological:  ?   Mental Status: She is alert and oriented to person, place, and time.  ? ?  MAU Course  ?Procedures ? ?MDM ? ?Wet prep & GC ?HIV, CBC, Hcg, ABO ?US OB transvaginal   ?A positive blood type  ? ?Assessment and Plan  ? ?A: ? ?1. Abdominal pain during pregnancy in first trimester   ?2. Vaginal discharge during pregnancy, antepartum   ?3. Bacterial vaginosis   ?  ? ?P: ? ?DC home ?RX: flagyl ?Return to MAU if symptoms worsen ?List of OB's in [place] given.  ? ?Duane Lopeasch, Miamor Ayler I, NP ?04/01/2022 ?6:07 PM ? ?

## 2022-04-01 NOTE — Discharge Instructions (Signed)
Prenatal Care Providers           [facility]  [address] [phone]  [facility]   [address]  [phone]  [facility] [place]       [address] [phone]            [facility] [place]     [address] [phone]          [facility] [place]   [address] [phone]  [facility]  [address] [phone]     [facility]  [address]   [phone]     [facility]  Phone: [phone]  [facility]  Phone: [phone]  [facility] Phone: [phone]  [facility] Phone: [phone]  [facility] Phone: [phone]  [facility] Phone: [phone]  [facility]  Phone: [phone]  

## 2022-04-02 LAB — GC/CHLAMYDIA PROBE AMP (~~LOC~~) NOT AT ARMC
Chlamydia: NEGATIVE
Comment: NEGATIVE
Comment: NORMAL
Neisseria Gonorrhea: NEGATIVE

## 2022-04-04 NOTE — ED Provider Notes (Signed)
?[place] ? ? ?[id number] ?04/01/22 Arrival Time: [id number] ? ?ASSESSMENT & PLAN: ? ?1. Positive pregnancy test   ?2. Vaginal spotting   ?3. Abdominal pain, left lower quadrant   ? ?UPT positive. With mild abdom/pelvic pain and spotting recommend MAU evaluation. She will proceed there now. Stable upon discharge. ? ? ? Follow-up Information   ? ? Go to  [facility] (MAU).   ?Contact information: ?[address] ?[place],  [place]  [id number] ? ?  ?  ? ?  ?  ? ?  ? ?Labs Reviewed  ?POC URINE PREG, ED - Abnormal; Notable for the following components:  ?    Result Value  ? Preg Test, Ur POSITIVE (*)   ? All other components within normal limits  ?POCT URINALYSIS DIPSTICK, ED / UC - Abnormal; Notable for the following components:  ? Hgb urine dipstick TRACE (*)   ? All other components within normal limits  ? ? ? ?Reviewed expectations re: course of current medical issues. Questions answered. ?Outlined signs and symptoms indicating need for more acute intervention. ?Patient verbalized understanding. ?After Visit Summary given. ? ? ?SUBJECTIVE: ?History from: patient. ?Rebecca Schneider is a 36 y.o. female who requests pregnancy test. Patient's last menstrual period was 01/30/2022 (approximate). Is having vague lower abd pain for few days. With brown spotting. ?No n/v. ? ?OB History   ? ? Gravida  ?3  ? Para  ?2  ? Term  ?2  ? Preterm  ?   ? AB  ?   ? Living  ?2  ?  ? ? SAB  ?   ? IAB  ?   ? Ectopic  ?   ? Multiple  ?0  ? Live Births  ?2  ?   ?  ?  ? ?History reviewed. No pertinent surgical history. ? ? ?OBJECTIVE: ? ?Vitals:  ? 04/01/22 1325  ?BP: 102/67  ?Pulse: 67  ?Resp: 16  ?Temp: 98.3 ?F (36.8 ?C)  ?TempSrc: Oral  ?SpO2: 100%  ?  ?General appearance: alert, oriented, no acute distress ?HEENT: [place]; AT; oropharynx moist ?Lungs: unlabored respirations ?Abdomen: soft; without distention; no specific tenderness to palpation; normal bowel sounds; without masses or  organomegaly; without guarding or rebound tenderness ?Back: without reported CVA tenderness; FROM at waist ?Extremities: without LE edema; symmetrical; without gross deformities ?Skin: warm and dry ?Neurologic: normal gait ?Psychological: alert and cooperative; normal mood and affect ? ?Labs: ?Results for orders placed or performed during the hospital encounter of 04/01/22  ?POC urine pregnancy  ?Result Value Ref Range  ? Preg Test, Ur POSITIVE (A) NEGATIVE  ?POC Urinalysis dipstick  ?Result Value Ref Range  ? Glucose, UA NEGATIVE NEGATIVE mg/dL  ? Bilirubin Urine NEGATIVE NEGATIVE  ? Ketones, ur NEGATIVE NEGATIVE mg/dL  ? Specific Gravity, Urine 1.010 1.005 - 1.030  ? Hgb urine dipstick TRACE (A) NEGATIVE  ? pH 5.5 5.0 - 8.0  ? Protein, ur NEGATIVE NEGATIVE mg/dL  ? Urobilinogen, UA 0.2 0.0 - 1.0 mg/dL  ? Nitrite NEGATIVE NEGATIVE  ? Leukocytes,Ua NEGATIVE NEGATIVE  ? ?Labs Reviewed  ?POC URINE PREG, ED - Abnormal; Notable for the following components:  ?    Result Value  ? Preg Test, Ur POSITIVE (*)   ? All other components within normal limits  ?POCT URINALYSIS DIPSTICK, ED / UC - Abnormal; Notable for the following components:  ? Hgb urine dipstick TRACE (*)   ? All other components  within normal limits  ? ? ? ?Allergies  ?Allergen Reactions  ? Tylenol [Acetaminophen]   ? ?                                            ?Past Medical History:  ?Diagnosis Date  ? Hypotension   ? began after delivery of baby 11/01/14  ? ? ?Social History  ? ?Socioeconomic History  ? Marital status: Married  ?  Spouse name: Not on file  ? Number of children: Not on file  ? Years of education: Not on file  ? Highest education level: Not on file  ?Occupational History  ? Not on file  ?Tobacco Use  ? Smoking status: Never  ? Smokeless tobacco: Never  ?Vaping Use  ? Vaping Use: Never used  ?Substance and Sexual Activity  ? Alcohol use: No  ? Drug use: No  ? Sexual activity: Yes  ?  Birth control/protection: Pill  ?Other Topics Concern  ?  Not on file  ?Social History Narrative  ? Not on file  ? ?Social Determinants of Health  ? ?Financial Resource Strain: Not on file  ?Food Insecurity: No Food Insecurity  ? Worried About Charity fundraiser in the Last Year: Never true  ? Ran Out of Food in the Last Year: Never true  ?Transportation Needs: Unmet Transportation Needs  ? Lack of Transportation (Medical): Yes  ? Lack of Transportation (Non-Medical): Yes  ?Physical Activity: Not on file  ?Stress: Not on file  ?Social Connections: Not on file  ?Intimate Partner Violence: Not on file  ? ? ?Family History  ?Problem Relation Age of Onset  ? Cancer Brother   ?     neck  ? Diabetes Maternal Grandfather   ? ?  ?Vanessa Kick, MD ?04/04/22 [phone] ? ?

## 2022-04-22 ENCOUNTER — Other Ambulatory Visit: Payer: Self-pay | Admitting: Family Medicine

## 2022-04-22 DIAGNOSIS — Z3481 Encounter for supervision of other normal pregnancy, first trimester: Secondary | ICD-10-CM

## 2022-04-28 ENCOUNTER — Other Ambulatory Visit: Payer: No Typology Code available for payment source

## 2022-04-28 DIAGNOSIS — Z3481 Encounter for supervision of other normal pregnancy, first trimester: Secondary | ICD-10-CM

## 2022-04-30 LAB — CBC/D/PLT+RPR+RH+ABO+RUBIGG...
Antibody Screen: NEGATIVE
Basophils Absolute: 0 10*3/uL (ref 0.0–0.2)
Basos: 0 %
Bilirubin, UA: NEGATIVE
EOS (ABSOLUTE): 0.1 10*3/uL (ref 0.0–0.4)
Eos: 1 %
Glucose, UA: NEGATIVE
HCV Ab: NONREACTIVE
HIV Screen 4th Generation wRfx: NONREACTIVE
Hematocrit: 38.9 % (ref 34.0–46.6)
Hemoglobin: 12.9 g/dL (ref 11.1–15.9)
Hepatitis B Surface Ag: NEGATIVE
Immature Grans (Abs): 0 10*3/uL (ref 0.0–0.1)
Immature Granulocytes: 0 %
Ketones, UA: NEGATIVE
Leukocytes,UA: NEGATIVE
Lymphocytes Absolute: 1.5 10*3/uL (ref 0.7–3.1)
Lymphs: 24 %
MCH: 30.4 pg (ref 26.6–33.0)
MCHC: 33.2 g/dL (ref 31.5–35.7)
MCV: 92 fL (ref 79–97)
Monocytes Absolute: 0.5 10*3/uL (ref 0.1–0.9)
Monocytes: 7 %
Neutrophils Absolute: 4.3 10*3/uL (ref 1.4–7.0)
Neutrophils: 68 %
Nitrite, UA: NEGATIVE
Platelets: 175 10*3/uL (ref 150–450)
RBC, UA: NEGATIVE
RBC: 4.24 x10E6/uL (ref 3.77–5.28)
RDW: 12.7 % (ref 11.7–15.4)
RPR Ser Ql: NONREACTIVE
Rh Factor: POSITIVE
Rubella Antibodies, IGG: 16.4 index (ref >0.99)
Specific Gravity, UA: 1.029 (ref 1.005–1.030)
Urobilinogen, Ur: 0.2 mg/dL (ref 0.2–1.0)
WBC: 6.4 10*3/uL (ref 3.4–10.8)
pH, UA: 6 (ref 5.0–7.5)

## 2022-04-30 LAB — HGB FRACTIONATION CASCADE
Hgb A2: 2.8 % (ref 1.8–3.2)
Hgb A: 97.2 % (ref 96.4–98.8)
Hgb F: 0 % (ref 0.0–2.0)
Hgb S: 0 %

## 2022-04-30 LAB — HCV INTERPRETATION

## 2022-04-30 LAB — MICROSCOPIC EXAMINATION
Bacteria, UA: NONE SEEN
Casts: NONE SEEN /lpf
Epithelial Cells (non renal): >10 /hpf — AB (ref 0–10)
WBC, UA: NONE SEEN /hpf (ref 0–5)

## 2022-04-30 LAB — URINE CULTURE, OB REFLEX

## 2022-05-05 ENCOUNTER — Encounter: Payer: Self-pay | Admitting: Family Medicine

## 2022-05-05 ENCOUNTER — Ambulatory Visit (INDEPENDENT_AMBULATORY_CARE_PROVIDER_SITE_OTHER): Payer: Self-pay | Admitting: Family Medicine

## 2022-05-05 VITALS — BP 95/56 | HR 78 | Ht 62.6 in | Wt 129.8 lb

## 2022-05-05 DIAGNOSIS — Z3481 Encounter for supervision of other normal pregnancy, first trimester: Secondary | ICD-10-CM

## 2022-05-05 DIAGNOSIS — Z3490 Encounter for supervision of normal pregnancy, unspecified, unspecified trimester: Secondary | ICD-10-CM | POA: Insufficient documentation

## 2022-05-05 DIAGNOSIS — F419 Anxiety disorder, unspecified: Secondary | ICD-10-CM

## 2022-05-05 IMAGING — US US OB COMP LESS 14 WK
1 series · 15 of 28 positions shown · non-contrast
Comparison: 01/22/2022

CLINICAL DATA: Abdominal pain, spotting

EXAM:
OBSTETRIC <14 WK ULTRASOUND
TECHNIQUE: Transabdominal ultrasound was performed for evaluation of the
gestation as well as the maternal uterus and adnexal regions.

[Series 1: us ob comp less 14 wk · 15 of 41 slices shown]
[im 1/41]
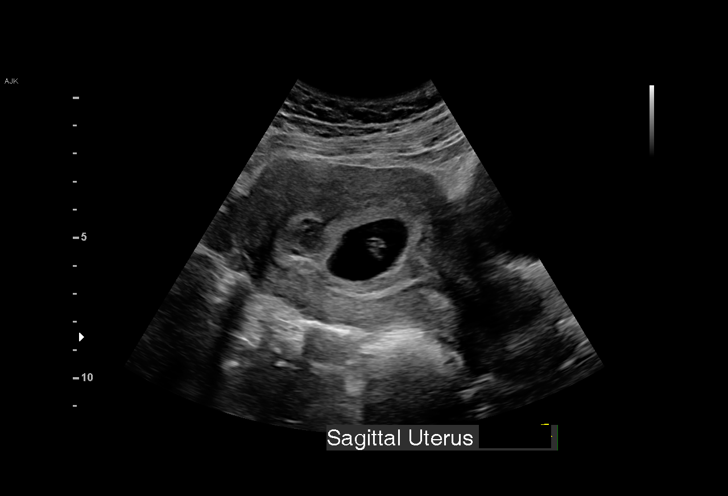
[im 3/41]
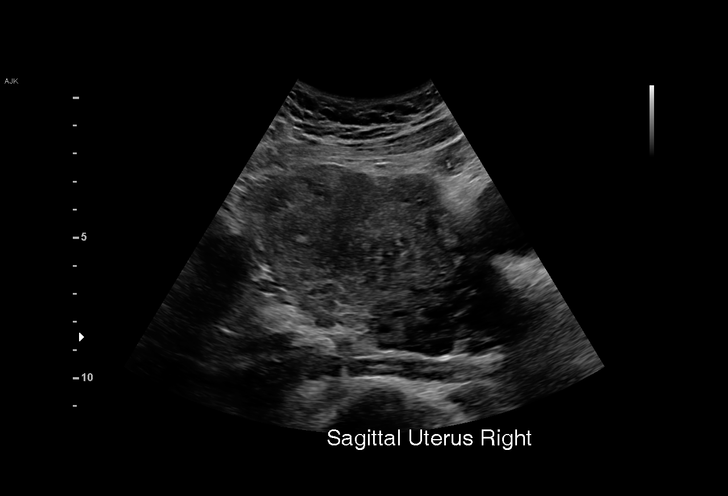
[im 6/41]
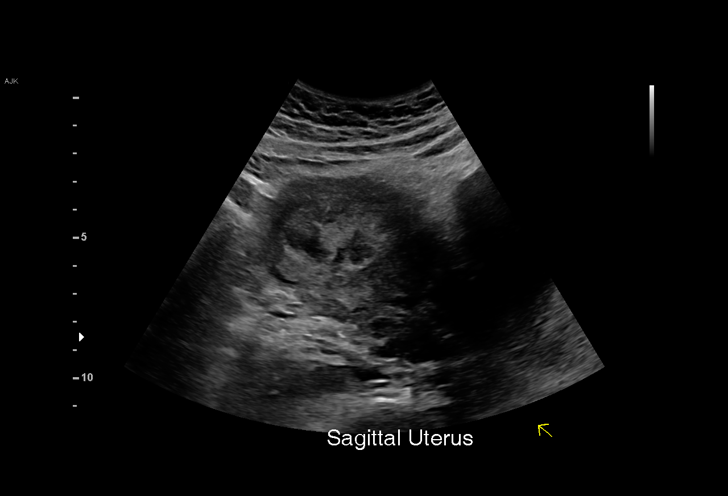
[im 9/41]
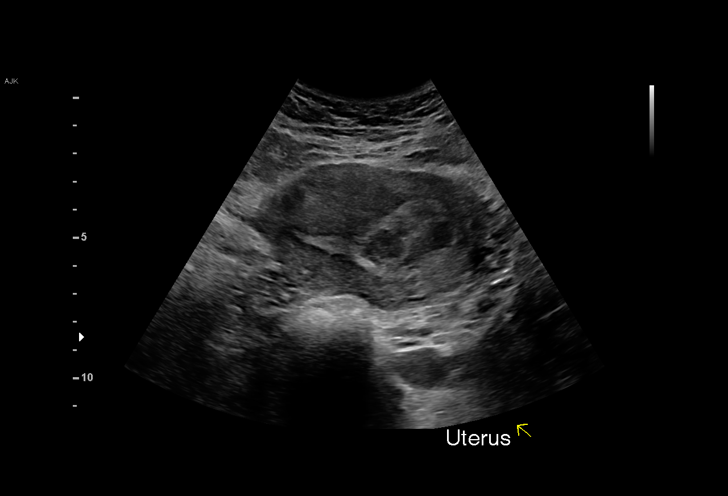
[im 12/41]
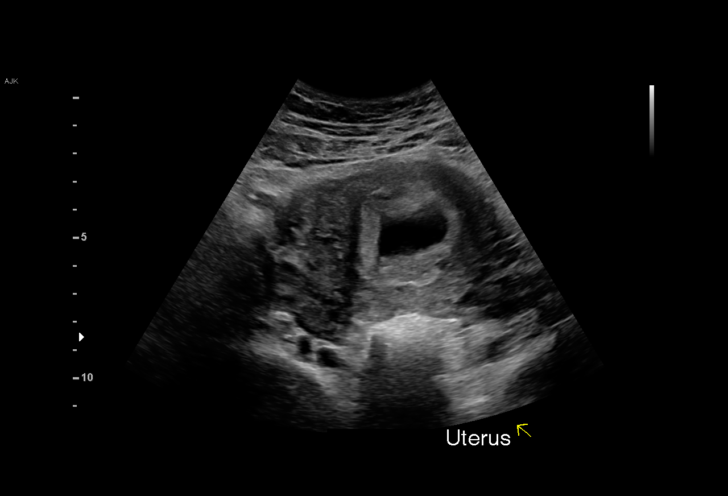
[im 15/41]
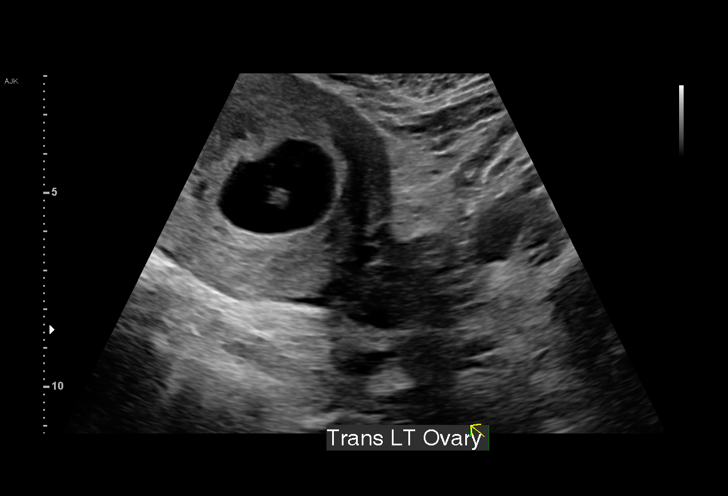
[im 18/41]
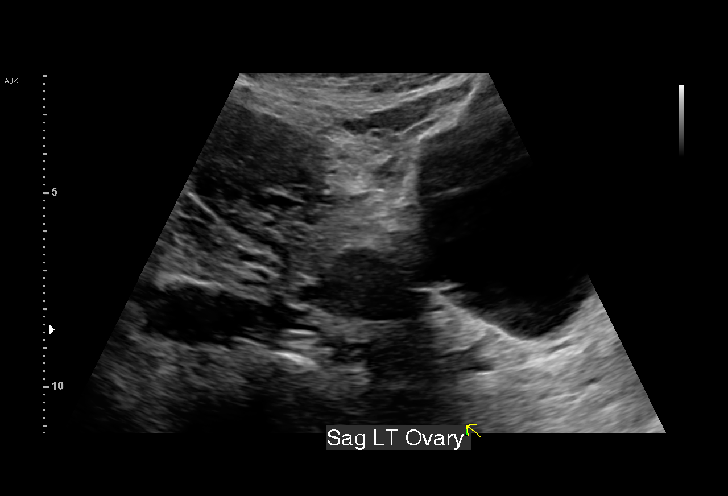
[im 21/41]
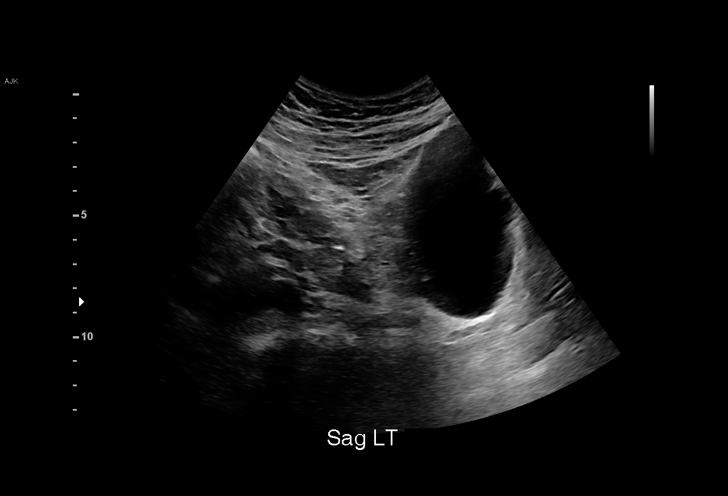
[im 23/41]
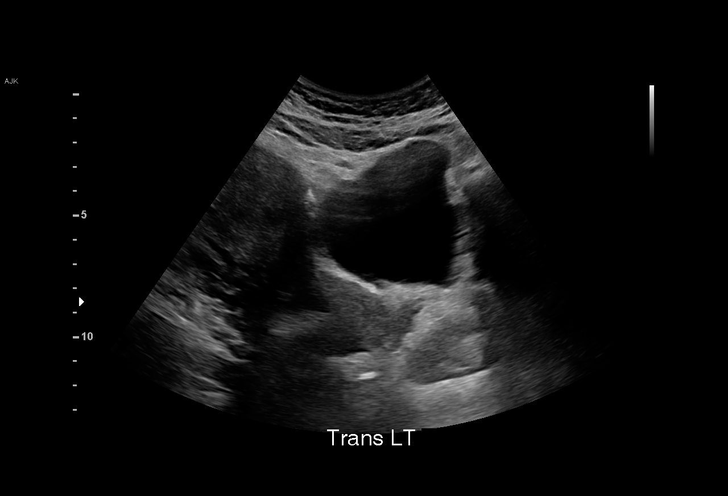
[im 26/41]
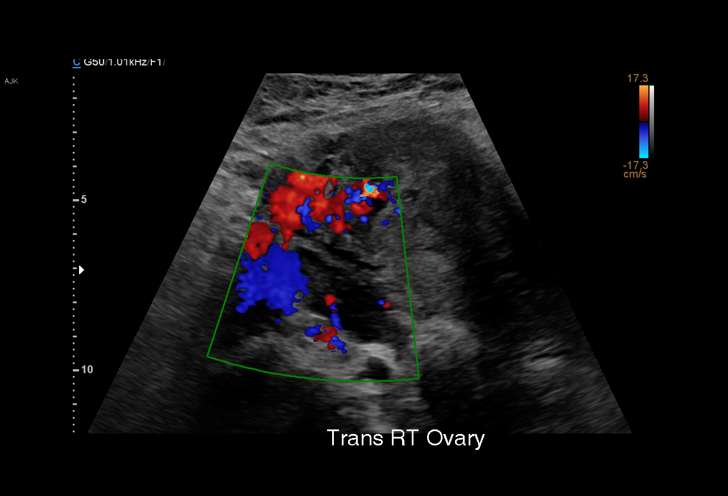
[im 29/41]
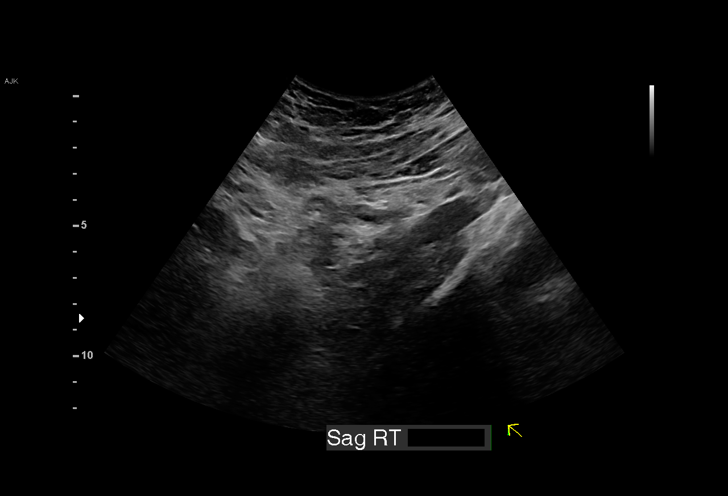
[im 32/41]
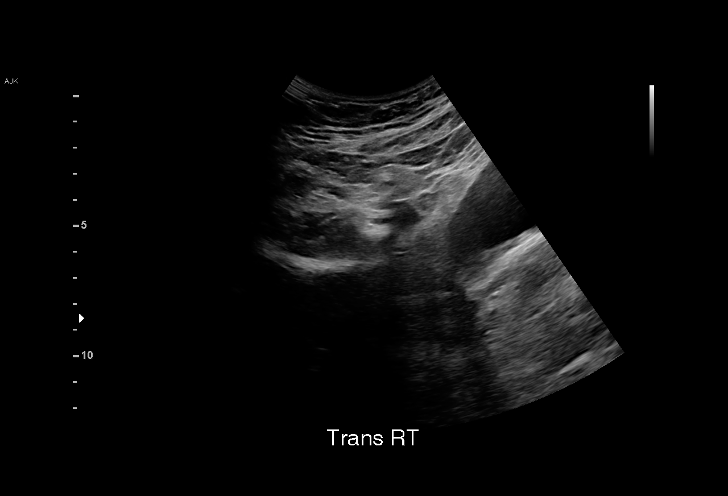
[im 35/41]
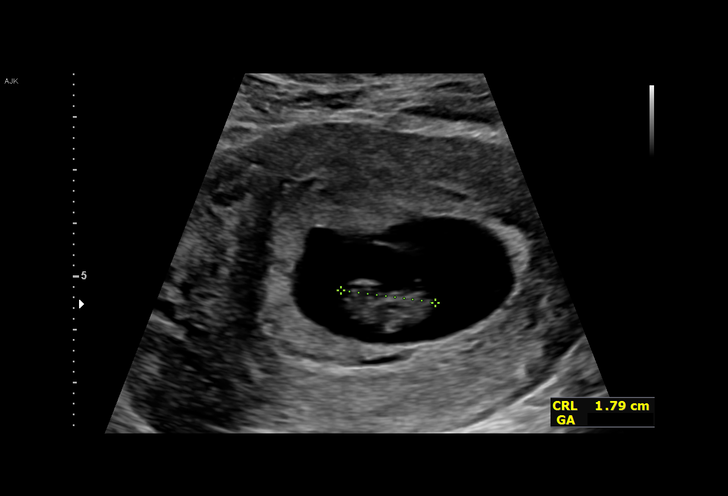
[im 38/41]
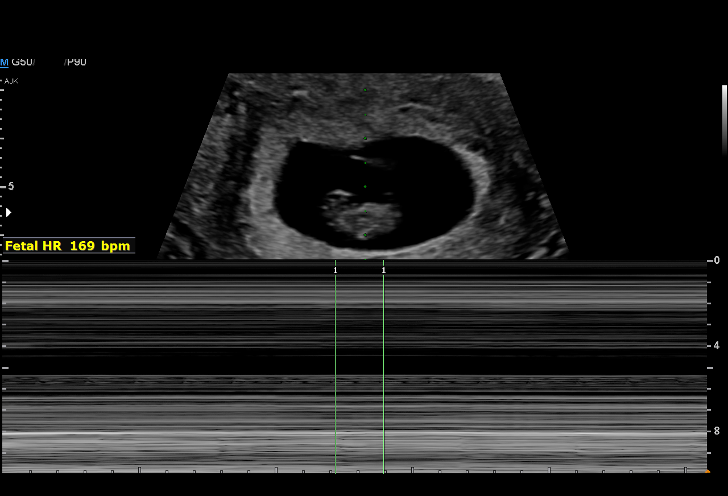
[im 41/41]
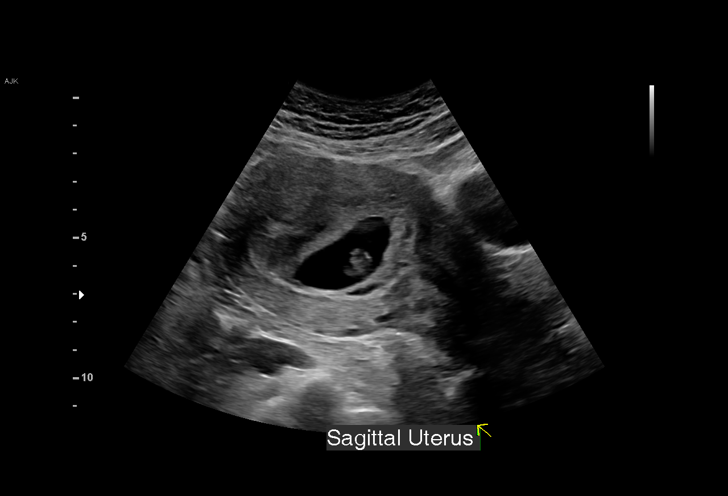

[15 of 28 positions shown; findings below may reference images not displayed]

FINDINGS: Intrauterine gestational sac: Single

Yolk sac:  Visualized.

Embryo:  Visualized.

Cardiac Activity: Visualized.

Heart Rate: 169 bpm

CRL:   17.7 mm   8 w 1 d                  US EDC: 11/10/2022

Subchorionic hemorrhage:  None visualized.

Maternal uterus/adnexae: No free fluid or adnexal mass. Right ovary
measures 2.8 x 2.9 x 2.1 cm and the left ovary measures 2.6 x 3.0 x
1.9 cm.
IMPRESSION: 1. Single live intrauterine pregnancy as above, estimated age 8
weeks and 1 day. Otherwise unremarkable exam.

## 2022-05-05 MED ORDER — HYDROXYZINE HCL 10 MG PO TABS: 10.0000 mg | ORAL_TABLET | Freq: Three times a day (TID) | ORAL | 0 refills | Status: DC | PRN

## 2022-05-05 MED ORDER — SERTRALINE HCL 25 MG PO TABS: 25.0000 mg | ORAL_TABLET | Freq: Every day | ORAL | 0 refills | Status: DC

## 2022-05-05 NOTE — Progress Notes (Signed)
Patient Name: Rebecca Schneider Date of Birth: 1986/03/16 [place] Initial Prenatal Visit  Rebecca Schneider is a 36 y.o. year old G3P2002 at [redacted]w[redacted]d who presents for her initial prenatal visit.   - Patient does report a concern for a very small amount of brown discharge that occurred when getting her blood work previously.  It has not recurred since and she does have some abdominal pain but no specific pelvic pain at this time.  -Patient reports that she has an issue with anxiety and depression.  She has a history of panic attacks that have been made her go to the hospital to get medicated in the ER (which she reports that she is usually given something and it goes away immediately).  She most recently has been having nightly panic attacks due to fear of traveling (this got worse during the pandemic) and her husband has been recently discussing possibly going to [place], which has her very fearful.  She has previously tried some maintenance medication in the past (believes it was prescribed at the general medical office on [address], but does not know which medication) but it sent her into worsening of her symptoms and she could not sleep for 2 weeks.  -Difficulty with sleeping, this is partially related to her panic attacks but also the fact that when she lays down she feels like she gets a significant amount of reflux which is very uncomfortable.  - Patient is also struggling with some constipation, she is unsure of what is safe to take during pregnancy and has trialed some prunes most recently.  Last bowel movement was yesterday.  -Patient does express some interest in a possible lab work, but is unsure that she will be able to do this with her lack of insurance.    Pregnancy is planned She reports  reflux, constipation, increased anxiety . She is taking a prenatal vitamin.  She denies pelvic pain or vaginal bleeding.   Pregnancy Dating: The patient is  dated by LMP.  LMP: 0000000 Period is certain:  Yes.  Periods were regular:  No.  LMP was a typical period:  Yes.  Using hormonal contraception in 3 months prior to conception: No  Lab Review: Blood type: A Rh Status: + Antibody screen: Negative HIV: Negative RPR: Negative Hemoglobin electrophoresis reviewed: Yes Results of OB urine culture are: Negative Rubella: Immune Hep C Ab: Negative Varicella status is Immune  PMH: Reviewed and as detailed below: HTN: No  Gestational Hypertension/preeclampsia: No  Type 1 or 2 Diabetes: No  Depression:  Yes, during her prior pregnancies and some after delivery. Had panic attacks as well afterwards. Currently having panic attacks related to travel. 2 years ago was traveling via car for 12 hours. Since the pandemic also has visions of being stabbed by things during the attacks. [facility] [address] [facility] and was given pills (unsure which medications).   Seizure disorder:  No VTE: No ,  History of STI No,  Abnormal Pap smear:  No, last pap was earlier this year. Genital herpes simplex:  No   PSH: Gynecologic Surgery:  no Surgical history reviewed, notable for: none  Obstetric History: Obstetric history tab updated and reviewed.  Summary of prior pregnancies: 2 normal pregnancies with no complications. (7y and 13y ago) Cesarean delivery: No  Gestational Diabetes:  No Hypertension in pregnancy: No History of preterm birth: No History of LGA/SGA infant:  No History of shoulder dystocia:  No Indications for referral were reviewed, and the patient has no obstetric indications for referral to [facility] at this time.   Social History: Partner's name: Serita Grammes   Tobacco use: No Alcohol use:  No Other substance use:  No  Current Medications:  Prenatal vitamins Tylenol as needed  Reviewed and appropriate in pregnancy.   Genetic and Infection Screen: Flow Sheet Updated  Yes  Prenatal Exam: Gen: Well nourished, well developed.  No distress.  Vitals noted. HEENT: Normocephalic, atraumatic.  Neck supple without cervical lymphadenopathy, thyromegaly or thyroid nodules.  Fair dentition. CV: RRR no murmur, gallops or rubs Lungs: CTA B.  Normal respiratory effort without wheezes or rales. Abd: soft, NTND. +BS.  Uterus not appreciated above pelvis. Ext: No clubbing, cyanosis or edema. Psych: Normal grooming and dress.  Not depressed or anxious appearing.  Normal thought content and process without flight of ideas or looseness of associations  Fetal heart tones: Appropriate  Assessment/Plan:  Rebecca Schneider is a 36 y.o. G3P2002 at [redacted]w[redacted]d who presents to initiate prenatal care. She is doing well.  Current pregnancy issues include:  Routine prenatal care: Prior dating ultrasound completed at the MAU Pre-pregnancy weight updated. Expected weight gain this pregnancy is 25-35 pounds  Prenatal labs reviewed, largely unremarkable Indications for referral to [facility] were reviewed and the patient does not meet criteria for referral.  Medication list reviewed and updated.  Recommended patient see a dentist for regular care.  Bleeding and pain precautions reviewed. Importance of prenatal vitamins reviewed.  Genetic screening offered. Patient opted for: no screening. The patient has the following indications for aspirinto begin 81 mg at 12-16 weeks: One high risk condition: no single high risk condition  MORE than one moderate risk condition: Age 59 or older  Aspirin was not  recommended today based upon above risk factors (one high risk condition or more than one moderate risk factor)  The patient will be age 53 or over at time of delivery. Referral to genetic counseling was offered today.  The patient has the following risk factors for preexisting diabetes: Reviewed indications for early 1 hour glucose testing, not indicated . An early 1 hour glucose tolerance  test was not ordered. Pregnancy Medical Home and PHQ-9 forms completed, problems noted: Yes  2. Pregnancy issues include the following which were addressed today:  Panic attacks with history of anxiety and depression: Feels this is chronic in nature but also worsened by current pregnancy.  We will trial sertraline 25 mg as well as needed hydroxyzine 10 mg 3 times daily.  Patient to follow-up in 1 week, also instructed to call the clinic if worsening symptoms with medication use. Constipation: Patient given handout for medications that are safe in pregnancy, recommended daily use of MiraLAX as needed Difficulty sleeping: Likely related to panic attacks/anxiety as well as significant reflux.  OTC medications for reflux recommended.  Treating panic attacks as above. Patient interested in water birth, unclear with lack of insurance if this is an option for her.   Follow-up in 1 week for anxiety medication monitoring Follow up 4 weeks for next prenatal visit.

## 2022-05-05 NOTE — Patient Instructions (Signed)
Your baby's heart rate looked great today.   For your anxiety and depression, we are going to start you on a medication called Sertraline that you will take once daily.   We are also giving you a medication called hydroxyzine that you can take when you feel a panic attack happening. This medication can make you sleepy so please be careful when taking it. You can take this up to three times a day if needed.   We are also going to schedule you for an ultrasound to make sure the dating of your pregnancy is correct. We will be in contact with you to get this scheduled.     El ritmo cardaco de su beb se vea muy bien hoy.  Para su ansiedad y depresin, comenzaremos con un medicamento llamado Sertralina que tomar una vez al da.  Tambin le estamos dando un medicamento llamado hidroxizina que puede tomar cuando sienta que est ocurriendo un ataque de pnico. Este medicamento puede causarle sueo, as que tenga cuidado al tomarlo. Puede tomar esto hasta tres veces al da si es necesario.  Tambin le programaremos una ecografa para asegurarnos de que la fecha de su embarazo sea [place]. Nos pondremos en contacto con usted para programarlo.

## 2022-05-12 ENCOUNTER — Ambulatory Visit: Payer: Self-pay | Admitting: Family Medicine

## 2022-05-12 VITALS — BP 92/50 | HR 74 | Wt 131.0 lb

## 2022-05-12 DIAGNOSIS — F419 Anxiety disorder, unspecified: Secondary | ICD-10-CM

## 2022-05-12 DIAGNOSIS — Z3482 Encounter for supervision of other normal pregnancy, second trimester: Secondary | ICD-10-CM

## 2022-05-12 MED ORDER — FAMOTIDINE 20 MG PO TABS: 20.0000 mg | ORAL_TABLET | Freq: Two times a day (BID) | ORAL | 1 refills | Status: DC

## 2022-05-12 NOTE — Progress Notes (Signed)
    SUBJECTIVE:   CHIEF COMPLAINT / HPI:   Anxiety Is doing better since starting the sertraline. Is no longer having daily anxiety issues and has not had to take the hydroxyzine at all.  She does note that she is little more tired during the day, she is taking the sertraline at night and has noticed some slight improvement in her sleeping.  Reflux Patient reports she is still having a significant amount of abdominal bloating and reflux.  She is taking Tums as needed.  She has some omeprazole at home but was not sure if she was able to take it.  Vaginal discharge Sunday was having some abdominal pain in the left side and had a little bit of brown discharge. She states that she has a pain that turns into a contraction-like feeling and then will have some mucous discharge with a tinge of brown color.  PERTINENT  PMH / PSH: Reviewed  OBJECTIVE:   BP (!) 92/50   Pulse 74   Wt 131 lb (59.4 kg)   LMP 01/30/2022 (Approximate)   BMI 23.50 kg/m   General: NAD, well-appearing, well-nourished Respiratory: No respiratory distress, breathing comfortably, able to speak in full sentences Skin: warm and dry, no rashes noted on exposed skin Psych: Appropriate affect and mood  Fetal heart tone: 139 bpm  ASSESSMENT/PLAN:   Vaginal discharge in pregnancy Patient noted small amount of brown discharge within a large amount of mucus discharge on Sunday.  Had a small pain in her right pelvis.  Patient had a picture of the discharge and there was only a small amount of brown present.  Reassuringly fetal heart tone was appropriate today and patient has not had discharge since. - Return precautions given - MAU precautions given  Anxious mood Improved with addition of sertraline and has not noted significant side effects other than some daytime sleepiness.  Has not had to use the as needed hydroxyzine. - Continue sertraline 25 mg daily   Reflux in pregnancy Significant reflux that is affecting  patient's quality of life.  We will trial famotidine as it is more affordable for patient.  Can also trial omeprazole to see which 1 is more effective. - Trial of famotidine prescribed - Can consider OTC omeprazole   Evelena Leyden, DO [facility]

## 2022-05-12 NOTE — Patient Instructions (Addendum)
Your baby's heart rate sounds great today.  Just keep it an eye on the amount of discharge you are having, if it becomes more concerning, has bright red blood or there is a lot of discharge with a lot of brown in it then you can go to the MAU.  For your reflux we are going to try a medication called famotidine as it is cheaper.  If that does not work you can use an over-the-counter medication called omeprazole.    El ritmo cardaco de su beb suena muy bien hoy. Solo vigile la cantidad de secrecin que est teniendo, si se vuelve ms preocupante, si tiene sangre de color rojo brillante o si hay mucha secrecin con mucho color marrn, entonces puede ir a la MAU.  Para su reflujo vamos a probar con un medicamento que se llama famotidina porque es ms barato. Si eso no funciona, puede usar un medicamento de venta libre llamado omeprazol.

## 2022-05-12 NOTE — Assessment & Plan Note (Signed)
Improved with addition of sertraline and has not noted significant side effects other than some daytime sleepiness.  Has not had to use the as needed hydroxyzine. - Continue sertraline 25 mg daily

## 2022-06-01 ENCOUNTER — Other Ambulatory Visit: Payer: Self-pay | Admitting: Family Medicine

## 2022-06-05 ENCOUNTER — Encounter: Payer: Self-pay | Admitting: Family Medicine

## 2022-06-05 ENCOUNTER — Ambulatory Visit (INDEPENDENT_AMBULATORY_CARE_PROVIDER_SITE_OTHER): Payer: Self-pay | Admitting: Family Medicine

## 2022-06-05 VITALS — BP 91/57 | HR 77 | Ht 61.81 in | Wt 135.5 lb

## 2022-06-05 DIAGNOSIS — O09522 Supervision of elderly multigravida, second trimester: Secondary | ICD-10-CM

## 2022-06-05 DIAGNOSIS — Z3482 Encounter for supervision of other normal pregnancy, second trimester: Secondary | ICD-10-CM

## 2022-06-05 NOTE — Progress Notes (Addendum)
  Patient Name: Rebecca Schneider Date of Birth: 12-23-1985 [facility] Prenatal Visit  Rebecca Schneider is a 36 y.o. G3P2002 at [redacted]w[redacted]d here for routine follow up. She is dated by LMP.  She reports  pelvic pain that is stable; contractions .  She denies vaginal bleeding.  See flow sheet for details.  Vitals:   06/05/22 1040  BP: (!) 91/57  Pulse: 77     A/P: Pregnancy at [redacted]w[redacted]d.  Doing well.    Routine Prenatal Care:  Dating reviewed, dating tab is correct Fetal heart tones Appropriate 151 Influenza vaccine not administered as not influenza season.   The patient has the following indication for screening preexisting diabetes: Reviewed indications for early 1 hour glucose testing, not indicated . Anatomy ultrasound ordered to be scheduled at 18-20 weeks. Patient is not interested in genetic screening.  Pregnancy education including expected weight gain in pregnancy, OTC medication use, continued use of prenatal vitamin, smoking cessation if applicable, and nutrition in pregnancy.   Bleeding and pain precautions reviewed. The patient has the following indications for aspirinto begin 81 mg at 12-16 weeks: One high risk condition: no single high risk condition  MORE than one moderate risk condition: none Aspirin was not  recommended today based upon above risk factors (one high risk condition or more than one moderate risk factor)   2. Pregnancy issues include the following and were addressed as appropriate today:  Anxiety/Depression: stable on sertraline but currently having anxiety due to upcoming travel plans. Discussed trial of smaller trip and use the hydroxyzine PRN on that trip to see if it works for her.   Problem list  and pregnancy box updated: Yes.  Leaving for vacation from July 29-August 14th, which limits ability to get an appointment in the 4 week mark, patient scheduled for next visit in [facility] An[facility] at [facility] scheduled  for 06/15/2022 Advanced for Maternal Age: declines genetic screening and counselor at this time.  Follow up 4 weeks.

## 2022-06-05 NOTE — Patient Instructions (Addendum)
I want you to take a smaller trip before your vacation and use the hydroxyzine (also known as Atarax or vistaril) before you travel and see if it helps with your anxiety.   If you have any concerns please let me know, your next visit is scheduled a bit further out than usual so please make sure to call our office [phone]) or go to the MAU.       Quiero que haga un viaje ms pequeo antes de sus vacaciones y use la hidroxizina (tambin conocida como Atarax o vistaril) antes de Tourist information centre manager y vea si le ayuda con su ansiedad.  Si tiene alguna inquietud, hgamelo saber, su prxima visita est programada un poco ms tarde de lo habitual, as que asegrese de llamar a nuestra oficina [phone]) o vaya a la MAU.   Precauciones de devolucin relacionadas con Reginia Naas  Los siguientes son signos/sntomas que son anormales en el Psychiatrist y pueden requerir una evaluacin adicional por parte de un mdico: Vaya a MAU en [facility] en [place] si: Tiene calambres/contracciones que no desaparecen con agua potable, especialmente si duran de 30 segundos a 1,5 minutos, aparecen y desaparecen cada 5-10 minutos durante una hora o ms, o si son cada vez ms fuertes y no puede caminar o Physiological scientist. Tu agua se rompe. A veces es un gran chorro de lquido, a veces es solo un goteo que sigue mojando tu ropa interior o corriendo por tus piernas. Tiene sangrado vaginal. No siente que su beb se mueva normalmente. Si no lo hace, busque algo para comer y beber (algo fro o algo con azcar Baker Hughes Incorporated de man o [place]) y acustese y concntrese en sentir cmo se mueve su beb. Si su beb todava no se mueve con normalidad, debe ir a MAU. Debe sentir que su beb se mueve 6 veces en una hora o 10 veces en dos horas. Tiene un dolor de cabeza persistente que no desaparece con 1 g de Tylenol, cambios en la visin, dolor en el pecho, dificultad para respirar, dolor  intenso en la parte superior derecha del abdomen, empeoramiento de la hinchazn de las piernas; todos estos pueden ser signos de presin arterial alta en el embarazo y necesita para ser evaluado por un proveedor inmediatamente  [place] son preocupantes durante el Psychiatrist y, si tiene alguno de [place], le recomiendo que llame a su PCP y se presente a Furniture conservator/restorer de Admisiones de Maternidad (mapa a continuacin) para Hotel manager.  Para cualquier emergencia relacionada con Firefighter, dirjase a la [place] de Admisiones de Maternidad en [address] de [place] y [facility]. Usar la Entrada C del hospital.

## 2022-07-13 ENCOUNTER — Ambulatory Visit: Payer: No Typology Code available for payment source | Admitting: Dermatology

## 2022-07-23 ENCOUNTER — Other Ambulatory Visit: Payer: Self-pay

## 2022-07-23 ENCOUNTER — Other Ambulatory Visit (HOSPITAL_COMMUNITY)
Admission: RE | Admit: 2022-07-23 | Discharge: 2022-07-23 | Disposition: A | Discharge status: Home / Self Care | Payer: Self-pay | Source: Ambulatory Visit | Attending: Family Medicine | Admitting: Family Medicine

## 2022-07-23 ENCOUNTER — Ambulatory Visit (INDEPENDENT_AMBULATORY_CARE_PROVIDER_SITE_OTHER): Payer: Self-pay | Admitting: Family Medicine

## 2022-07-23 VITALS — BP 95/69 | HR 98 | Wt 146.0 lb

## 2022-07-23 DIAGNOSIS — Z8759 Personal history of other complications of pregnancy, childbirth and the puerperium: Secondary | ICD-10-CM

## 2022-07-23 DIAGNOSIS — F419 Anxiety disorder, unspecified: Secondary | ICD-10-CM

## 2022-07-23 DIAGNOSIS — O26859 Spotting complicating pregnancy, unspecified trimester: Secondary | ICD-10-CM

## 2022-07-23 DIAGNOSIS — Z3482 Encounter for supervision of other normal pregnancy, second trimester: Secondary | ICD-10-CM | POA: Insufficient documentation

## 2022-07-23 LAB — POCT URINALYSIS DIP (MANUAL ENTRY)
Bilirubin, UA: NEGATIVE
Glucose, UA: NEGATIVE mg/dL
Nitrite, UA: NEGATIVE
Spec Grav, UA: 1.02 (ref 1.010–1.025)
Urobilinogen, UA: 1 E.U./dL
pH, UA: 6.5 (ref 5.0–8.0)

## 2022-07-23 LAB — POCT WET PREP (WET MOUNT)
Clue Cells Wet Prep Whiff POC: NEGATIVE
Trichomonas Wet Prep HPF POC: ABSENT

## 2022-07-23 LAB — POCT UA - MICROSCOPIC ONLY: Epithelial cells, urine per micros: >20

## 2022-07-23 NOTE — Progress Notes (Signed)
[facility] Visit  Rebecca Schneider Rebecca Schneider is a 36 y.o. G3P2002 at [redacted]w[redacted]d (via LMP = 8w sono) who presents to [facility] for routine follow up. Seen today along with resident Dr. Velna Ochs. Prenatal course, history, notes, ultrasounds, and laboratory results reviewed. Spanish interpreter utilized during today's visit.   Denies fluid leaking or decreased fetal movement. Endorses having some slight brownish discharge this AM, has picture of the discharge and it is slightly off-white. Also endorses having intermittent hardening pressure of her stomach all throughout the day for the last four months. Denies dysuria.  Taking PNV.  No longer taking sertraline or hydroxyzine, reports mood and anxiety have been doing well lately.  Primary Prenatal Care Provider: Dr. Clayborne Artist  Postpartum Plans: - delivery planning: plan SVD - notably had extremely precipitous delivery in her G2 pregnancy (delivered in car after having no prodromal labor symptoms) - circumcision: declines - feeding: breast - pediatrician[facility] - contraception: condoms  FHR: 150bpm Uterine size: 25cm GU: normal appearing external genitalia without lesions. Vagina is moist with white discharge, with small brown clot at cervical os. Cervix visually closed. Wet prep, gc/chl/trich swab obtained. Chaperone present: Maree Erie, CMA   Assessment & Plan  1. Routine prenatal care: - anatomy ultrasound result requested from [facility] - done on 7/17, normal anatomy, normal AFI, anterior fundal placenta without previa, female fetus, EFW 23%  2. History of anxious mood: previously on sertraline and hydroxyzine, no longer taking and reports is doing well - advised medications are safe in pregnancy if she needs them, encouraged her to reach out to [name] if she feels she needs  3. History of precipitous delivery - delivered in car in second pregnancy after no prodromal labor  symptoms - encouraged going to hospital ASAP at onset of any signs of labor  4. Brown discharge/stomach tightening: strongly doubt preterm labor based on ongoing tightening for 4 months. Patient reports last sexual activity 4 days ago, prior to that had not had intercourse in some time. Cervix visibly closed on exam, with no active bleeding from os, just small brown clot present. Suspect spotting is from intercourse & vascular cervix in pregnancy. Rh positive. - send gc/chl/trich - wet prep obtained - normal - UA has some bacteria, appears not clean catch, will send for OB urine culture - discussed MAU precautions - reviewed plan with OB attending Dr. Adrian Blackwater who is in agreement  Next prenatal visit in 3 weeks. Will need CBC, HIV, RPR, 1 hour GTT, and letter for Tdap & flu vaccine at that visit.  Labor & fetal movement precautions discussed.  Levert Feinstein, MD [facility]

## 2022-07-23 NOTE — Patient Instructions (Addendum)
We tested you today for vaginal infection as well as urinary tract infection. If any of these tests return abnormal we will call you.   Go to the MAU at [facility] if: You have cramping/contractions that do not go away with drinking water Your water breaks.  Sometimes it is a big gush of fluid, sometimes it is just a trickle that keeps getting your underwear wet or running down your legs You have vaginal bleeding.    You do not feel your baby moving like normal.  If you do not, get something to eat and drink and lay down and focus on feeling your baby move. If your baby is still not moving like normal, you should go to MAU.   Segundo trimestre de [employer] Second Trimester of Pregnancy  El segundo trimestre de Psychiatrist va desde la semana 13 hasta la semana 27. Es Designer, jewellery desde el mes 4 hasta el mes 6 de [place]. El segundo trimestre suele ser el momento en el que mejor se siente. Su organismo se ha adaptado a Charity fundraiser, y comienza a Diplomatic Services operational officer. Durante el segundo trimestre: Las nuseas del embarazo han disminuido o han desaparecido completamente. Usted puede tener ms energa. Es posible que tenga un aumento del apetito. El segundo trimestre es tambin un perodo en el que el beb en gestacin (feto) crece rpidamente. Hacia el final del sexto mes, el feto puede medir aproximadamente 12 pulgadas y pesar alrededor de 1 libras. Es probable que sienta que el beb se mueve (da pataditas) entre las 16 y 20 semanas del Psychiatrist. Cambios en el cuerpo durante el segundo trimestre Su cuerpo continua experimentando numerosos cambios durante su segundo trimestre. Los cambios varan y generalmente vuelven a la normalidad despus del nacimiento del beb. Cambios fsicos Seguir American Standard Companies. Notar que la parte baja del abdomen sobresale. Podrn aparecer las primeras Albertson's caderas, el abdomen y las [place]. Las ConAgra Foods seguirn creciendo y se  tornarn sensibles. Pueden aparecer zonas oscuras o manchas (cloasma o mscara del embarazo) en el rostro. Es posible que se forme una lnea oscura desde el ombligo hasta la zona del pubis (linea nigra). Tal vez haya cambios en el cabello. Esto cambios pueden incluir su engrosamiento, crecimiento rpido y Allied Waste Industries textura. A algunas personas tambin se les cae el cabello durante o despus del [place], o tienen el cabello seco o fino. Cambios en la salud Comienza a tener dolores de [place]. Es posible que tenga acidez estomacal. Puede tener estreimiento. Pueden aparecer hemorroides o abultarse e hincharse las venas (venas varicosas). Las Veterinary surgeon y estar sensibles al cepillado y al hilo dental. Nicanor Bake vez tenga necesidad de orinar con ms frecuencia porque el feto est ejerciendo presin sobre la vejiga. Puede sentir dolor en la espalda. Esto se debe a: Aumento de peso. Las hormonas del Management consultant las articulaciones en la pelvis. Un cambio en el peso y los msculos que ayudan a Pharmacologist su equilibrio. Siga estas instrucciones en su casa: Medicamentos Siga las instrucciones del mdico en relacin con el uso de medicamentos. Durante el embarazo, hay medicamentos que pueden tomarse y otros que no. No tome medicamentos a menos que lo haya autorizado el mdico. Tome vitaminas prenatales que contengan por lo menos 600 microgramos (mcg) de cido flico. Comida y bebida Lleve una dieta saludable que incluya frutas y verduras frescas, cereales integrales, buenas fuentes de protenas como carnes White, huevos o tofu, y productos lcteos descremados.  Evite la carne cruda y el [place], la [place] y el queso sin Market researcher. Estos portan grmenes que pueden provocar dao tanto a usted como al beb. Es posible que tenga que tomar estas medidas para prevenir o tratar el estreimiento: Product manager suficiente lquido como para Pharmacologist la orina de color amarillo plido. Consumir alimentos ricos en  fibra, como frijoles, cereales integrales, y frutas y verduras frescas. Limitar el consumo de alimentos ricos en grasa y azcares procesados, como los alimentos fritos o dulces. Actividad Haga ejercicio solamente como se lo haya indicado el mdico. La mayora de las personas pueden continuar su rutina de ejercicios durante el [place]. Intente realizar como mnimo 30 minutos de actividad fsica por lo menos 5 das a la [place]. Deje de hacer ejercicio si comienza a Teacher, music. Deje de hacer ejercicio si le aparecen dolor o clicos en la parte baja del vientre o de la espalda. Evite hacer ejercicio si hace mucho calor o humedad, o si se encuentra a una altitud elevada. Evite levantar pesos Fortune Brands. Si lo desea, puede seguir teniendo The [place]. [place] Travelers, salvo que el mdico le indique lo contrario. Alivio del dolor y del Dentist Use un sujetador que le brinde buen soporte para prevenir las molestias causadas por la sensibilidad en las [place]. Dese baos de asiento con agua tibia para Engineer, materials o las molestias causadas por las hemorroides. Use una crema para las hemorroides si el mdico la autoriza. Descanse con las piernas levantadas (elevadas) si tiene calambres en las piernas o dolor en la parte baja de la espalda. Si tiene venas varicosas: Use medias de compresin como se lo haya indicado el mdico. Eleve los pies durante 15 minutos, 3 o 4 veces por da. Limite el consumo de sal en su dieta. Seguridad Use el cinturn de seguridad en todo momento mientras conduce o va en auto. Hable con el mdico si es vctima de Genuine Parts o fsico. Estilo de vida No se d baos de inmersin en agua caliente, baos turcos ni saunas. No se haga duchas vaginales. No use tampones ni toallas higinicas perfumadas. Evite el contacto con las bandejas sanitarias de los gatos y la tierra que estos animales usan. Estos elementos contienen bacterias que pueden causar defectos congnitos  al beb y la posible prdida del feto debido a un aborto espontneo o muerte fetal. No use remedios a base de hierbas, alcohol, drogas ilegales ni medicamentos que no estn aprobados por el mdico. Las sustancias qumicas de estos productos pueden daar al beb. No consuma ningn producto que contenga nicotina o tabaco, como cigarrillos, cigarrillos electrnicos y tabaco de Theatre manager. Si necesita ayuda para dejar de fumar, consulte al mdico. Instrucciones generales Durante una visita prenatal de rutina, el Office Depot har un examen fsico y Probation officer. Tambin le hablar sobre su salud general. Cumpla con todas las visitas de seguimiento. Esto es importante. Pdale al mdico que la derive a clases de educacin prenatal en su localidad. Pida ayuda si tiene necesidades nutricionales o de asesoramiento Academic librarian. El mdico puede aconsejarla o derivarla a especialistas para que la ayuden con diferentes necesidades. Dnde buscar ms informacin American Pregnancy Association (Asociacin Americana del Embarazo): americanpregnancy.org Celanese Corporation of Obstetricians and Gynecologists (Colegio Estadounidense de Obstetras y [place]): EmploymentAssurance.cz? Office on Pitney Bowes (Oficina para la Salud de la Mujer): MightyReward.co.nz Comunquese con un mdico si tiene: Un dolor de cabeza que no desaparece despus de Science writer. Cambios en la visin o ve manchas delante de los  ojos. Clicos leves, presin en la pelvis o dolor persistente en el abdomen. Nuseas persistentes, vmitos o diarrea. Secrecin vaginal con mal olor u orina con Charles Schwab ftido. Dolor al Beatrix Shipper. Hinchazn sbita o extrema del rostro, las [address], los tobillos, los pies o las piernas. Grant Ruts. Busque ayuda de inmediato si: Tiene una prdida de lquido por la vagina. Tiene sangrado ligero o manchas vaginales. Tiene dolor intenso o clicos en el abdomen. Presenta dificultad para respirar. Siente  dolor en el pecho. Tiene episodios de Baxter International. No ha sentido a su beb moverse durante el perodo de Sempra Energy indic el mdico. Tiene dolor, hinchazn o enrojecimiento nuevos o ms intensos en un brazo o una pierna. Resumen El segundo trimestre de embarazo va desde la semana 13 hasta la 27 (desde el mes 4 hasta el 6). No use remedios a base de hierbas, alcohol, drogas ilegales ni medicamentos que no estn aprobados por el mdico. Las sustancias qumicas de estos productos pueden daar al beb. Haga ejercicio solamente como se lo haya indicado el mdico. La mayora de las personas pueden continuar su rutina de ejercicios durante el [place]. Cumpla con todas las visitas de seguimiento. Esto es importante. Esta informacin no tiene Theme park manager el consejo del mdico. Asegrese de hacerle al mdico cualquier pregunta que tenga. Document Revised: 05/27/2020 Document Reviewed: 05/27/2020 Elsevier Patient Education  2023 ArvinMeritor.

## 2022-07-23 NOTE — Assessment & Plan Note (Signed)
previously on sertraline and hydroxyzine, no longer taking and reports is doing well - advised medications are safe in pregnancy if she needs them, encouraged her to reach out to [name] if she feels she needs

## 2022-07-24 LAB — CERVICOVAGINAL ANCILLARY ONLY
Chlamydia: NEGATIVE
Comment: NEGATIVE
Comment: NEGATIVE
Comment: NORMAL
Neisseria Gonorrhea: NEGATIVE
Trichomonas: NEGATIVE

## 2022-07-26 LAB — URINE CULTURE, OB REFLEX

## 2022-07-26 LAB — CULTURE, OB URINE

## 2022-07-30 ENCOUNTER — Ambulatory Visit (INDEPENDENT_AMBULATORY_CARE_PROVIDER_SITE_OTHER): Payer: Self-pay | Admitting: Family Medicine

## 2022-07-30 VITALS — BP 93/64 | HR 78 | Wt 148.8 lb

## 2022-07-30 DIAGNOSIS — Z3482 Encounter for supervision of other normal pregnancy, second trimester: Secondary | ICD-10-CM

## 2022-07-30 DIAGNOSIS — R42 Dizziness and giddiness: Secondary | ICD-10-CM

## 2022-07-30 DIAGNOSIS — H539 Unspecified visual disturbance: Secondary | ICD-10-CM

## 2022-07-30 MED ORDER — ONDANSETRON HCL 4 MG PO TABS: 4.0000 mg | ORAL_TABLET | Freq: Three times a day (TID) | ORAL | 0 refills | Status: DC | PRN

## 2022-07-30 MED ORDER — ONDANSETRON 4 MG PO TBDP
4.0000 mg | ORAL_TABLET | Freq: Once | ORAL | Status: AC
  Administered 2022-07-30: 4 mg via ORAL

## 2022-07-30 NOTE — Assessment & Plan Note (Addendum)
Acute nausea, headache, dizziness, vision changes, and presyncope.  Unknown etiology.  BP low normal, but consistent with all prior measurements this pregnancy.  No history of preeclampsia.  Given headache and vision changes, considered normotensive preeclampsia but this is rare. Discussed with patient going to the MAU for medications, IV fluids, and lab work.  After much thought, patient tearfully declined because she is currently uninsured and just paid off the bill from her last healthcare encounter.  Due to our clinic lab closing, we are only able to obtain a urinalysis today. Patient booked for repeat appointment tomorrow morning with myself at 1030; at that time, we will collect urine for protein creatinine ratio and blood for CBC and CMP.

## 2022-07-30 NOTE — Patient Instructions (Signed)
I have translated the following text using Google translate.  As such, there are many errors.  I apologize for the poor written translation; however, we do not have written  translation services yet. It was wonderful to see you today.  He traducido el siguiente texto [employer] traductor de Genuine Parts. Como tal, hay muchos errores. Pido disculpas por la mala traduccin escrita; sin embargo, an no contamos con servicios de traduccin escrita. Fue maravilloso verte hoy.  Dizziness and nausea I have sent zofran to your pharmacy. Please pick it up.  We gave you nausea medicine here in clinic. Please drink a lot of water and Gatorade overnight. Come back tomorrow morning for an appointment to see how you do with the Zofran. Mareos y nuseas He enviado zofran a su farmacia. Por favor recgelo. Le dimos medicina para las nuseas aqu en la clnica. Beba mucha agua y Gatorade durante la noche. Vuelva maana por la maana para programar una cita y ver cmo le va con Zofran.   If you have any questions or concerns, please do not hesitate to contact us via phone or MyChart message.   Fayette Pho, MD

## 2022-07-30 NOTE — Progress Notes (Signed)
    SUBJECTIVE:   CHIEF COMPLAINT / HPI:   Pregnant 36 year old woman here at [redacted]w[redacted]d for nausea, headache, dizziness.  She is in the adopt a mom program. She speaks [place].  Video Spanish interpreter utilized for duration of visit; Georgette Shell [phone] translated in first half and due to technical difficulties, Lyndee Leo #[id number] translated the second half.  Blood pressure 93/64, pulse 78, weight 148 lb 12.8 oz (67.5 kg), last menstrual period 01/30/2022.   Standard pregnancy questions: Baby moving normally, "too much" No vaginal fluid or bleeding - some brownish discharge, looked like a plug of red discharge only once A few cramps or contractions  Nausea, headache, and dizziness Onset Friday, no known aggravating factors. Symptoms: Nausea, dizziness, headache, light sensitivity, blurry vision, little black spots in vision, presyncope Headache is all over, especially superiorly and behind her eyes.  It is different than past headaches. Denies RUQ rib pain, rashes, lesions, pruritus, vomiting No prior history of preeclampsia 2 previous normal pregnancies; precipitous delivery of second pregnancy  PERTINENT  PMH / PSH:  Patient Active Problem List   Diagnosis Date Noted   Dizziness 07/30/2022   History of precipitous delivery 07/23/2022   Supervision of normal pregnancy 05/05/2022   Anxious mood 05/05/2022   Vaginal delivery 11/01/2014   OBJECTIVE:   BP 93/64   Pulse 78   Wt 148 lb 12.8 oz (67.5 kg)   LMP 01/30/2022 (Approximate)   BMI 27.38 kg/m    General: Awake, alert, somewhat anxious, no acute distress Abdomen: Baby appears to be in transverse position, uterine fundus 2 cm above umbilicus, fetal heart tones in the 140s  ASSESSMENT/PLAN:   Dizziness Acute nausea, headache, dizziness, vision changes, and presyncope.  Unknown etiology.  BP low normal, but consistent with all prior measurements this pregnancy.  No history of preeclampsia.  Given headache and vision changes,  considered normotensive preeclampsia but this is rare. Discussed with patient going to the MAU for medications, IV fluids, and lab work.  After much thought, patient tearfully declined because she is currently uninsured and just paid off the bill from her last healthcare encounter.  Due to our clinic lab closing, we are only able to obtain a urinalysis today. Patient booked for repeat appointment tomorrow morning with myself at 1030; at that time, we will collect urine for protein creatinine ratio and blood for CBC and CMP.     Fayette Pho, MD [facility]

## 2022-07-31 ENCOUNTER — Ambulatory Visit (INDEPENDENT_AMBULATORY_CARE_PROVIDER_SITE_OTHER): Payer: Self-pay | Admitting: Family Medicine

## 2022-07-31 VITALS — BP 92/52 | HR 73 | Wt 147.6 lb

## 2022-07-31 DIAGNOSIS — R42 Dizziness and giddiness: Secondary | ICD-10-CM

## 2022-07-31 DIAGNOSIS — Z8759 Personal history of other complications of pregnancy, childbirth and the puerperium: Secondary | ICD-10-CM

## 2022-07-31 DIAGNOSIS — H539 Unspecified visual disturbance: Secondary | ICD-10-CM

## 2022-07-31 DIAGNOSIS — Z3482 Encounter for supervision of other normal pregnancy, second trimester: Secondary | ICD-10-CM

## 2022-07-31 LAB — PROTEIN / CREATININE RATIO, URINE
Creatinine, Urine: 43.4 mg/dL
Protein, Ur: 4.7 mg/dL
Protein/Creat Ratio: 108 mg/g creat (ref 0–200)

## 2022-07-31 LAB — URINALYSIS
Bilirubin, UA: NEGATIVE
Glucose, UA: NEGATIVE
Ketones, UA: NEGATIVE
Leukocytes,UA: NEGATIVE
Nitrite, UA: NEGATIVE
Protein,UA: NEGATIVE
RBC, UA: NEGATIVE
Specific Gravity, UA: 1.013 (ref 1.005–1.030)
Urobilinogen, Ur: 0.2 mg/dL (ref 0.2–1.0)
pH, UA: 6.5 (ref 5.0–7.5)

## 2022-07-31 MED ORDER — ONDANSETRON HCL 4 MG PO TABS: 4.0000 mg | ORAL_TABLET | Freq: Three times a day (TID) | ORAL | 0 refills | Status: DC | PRN

## 2022-07-31 NOTE — Patient Instructions (Signed)
I have translated the following text using Google translate.  As such, there are many errors.  I apologize for the poor written translation; however, we do not have written  translation services yet. It was wonderful to see you today. Thank you for allowing me to be a part of your care. Below is a short summary of what we discussed at your visit today: He traducido el siguiente texto [employer] traductor de [employer]. Como tal, hay muchos errores. Pido disculpas por la mala traduccin escrita; sin embargo, an no contamos con servicios de traduccin escrita. Fue maravilloso verte hoy. Gracias por permitirme ser parte de su cuidado. A continuacin se muestra un breve resumen de lo que discutimos en su visita de hoy:  Nausea and headache I sent prescription nausea medicine called Zofran to your pharmacy.  Please use this as needed (no sooner than every 8 hours) to help with nausea.  Keep drinking as much fluid as you can.  Aim for water, Pedialyte, or Gatorade.  I am going to collect blood work on you today. If the results are normal, I will send you a letter or MyChart message. If the results are abnormal, I will give you a call.    Come back for your next prenatal visit on 9/25.  Appointment details in the next page. Nuseas y dolor de cabeza Envi un medicamento recetado para las nuseas llamado Zofran a su farmacia. selo segn sea necesario (no antes de cada 8 horas) para ayudar con las nuseas.  Siga bebiendo tanto lquido como pueda. Apunta a agua, Pedialyte o Gatorade.  Hoy voy a hacerte anlisis de [place]. Si los resultados son normales, le enviar una carta o un mensaje de Clinical cytogeneticist. Si los resultados son anormales, te Freight forwarder.  Vuelva para su prxima visita prenatal el 25/9. Detalles de la cita en la pgina siguiente.  Prenatal Classes Go to [email or web address] for more information on the pregnancy and child birth classes that [place] has to offer.   Clases prenatales Vaya a [email or web address] para obtener ms informacin sobre las clases de Psychiatrist y parto que [place] tiene para Actor.  Emergency planning Planificacin de emergencias Si experimenta sangrado vaginal, prdida de lquidos, no siente que su beb se mueva tanto o comienza a Technical brewer con menos de 5 minutos de diferencia, vaya directamente a la Unidad de Evaluacin Materna en [facility] para una evaluacin.  Servicios de atencin de mujeres y maternidad ubicados en el lado sur de [place]. [facility] (Entrada C en [address] [place], [place] [address]      If you have any questions or concerns, please do not hesitate to contact us via phone or MyChart message.  Si tiene alguna pregunta o inquietud, no dude en comunicarse con nosotros por telfono o mensaje de MyChart.  Fayette Pho, MD

## 2022-07-31 NOTE — Progress Notes (Signed)
    SUBJECTIVE:   CHIEF COMPLAINT / HPI:   Ms. Rebecca Schneider is a pleasant 36 year old G3 P2-0-0-2 female currently at 26 weeks 0 days gestation.  Today is a follow-up from her visit yesterday where she was evaluated by myself for nausea, headache, dizziness, and vision changes.  Yesterday we were able to collect a urine sample and provide her Zofran.  She was strongly encouraged to go to the MAU for evaluation and possible IV fluids, however politely declined after much deliberation due to her uninsured status and the cost of hospital care. She was encouraged to drink water and Pedialyte heavily overnight with close follow-up today.  Today, she reports modest improvement of her symptoms with the Zofran.  She is still having headache and black spots in her vision, but they are somewhat better.  Baby is still moving well for her.  She denies vaginal bleeding.    She does report 3 strong contractions each lasting 20 seconds since her appointment yesterday afternoon.  Of note, she does have a history of precipitous delivery with G2 pregnancy; per the note, she delivered in a car after no prodromal labor symptoms.  Patient reports both previous pregnancies were otherwise unremarkable.  PERTINENT  PMH / PSH:  Patient Active Problem List   Diagnosis Date Noted   Dizziness 07/30/2022   History of precipitous delivery 07/23/2022   Supervision of normal pregnancy 05/05/2022   Anxious mood 05/05/2022   Vaginal delivery 11/01/2014    OBJECTIVE:   BP (!) 92/52   Pulse 73   Wt 147 lb 9.6 oz (67 kg)   LMP 01/30/2022 (Approximate)   BMI 27.16 kg/m    General: Awake, alert, no acute distress Cardiac: Regular rate and rhythm, no murmurs Respiratory: CTAB Abdomen: Gravid abdomen, fetal heart tones 130-134 bpm  ASSESSMENT/PLAN:   Dizziness Modestly improved.  Patient reports improvement overnight after Zofran in clinic and copious fluid intake at home.  Today still endorsing headache and  vision changes, although improved from yesterday.  Again, unclear etiology.  Blood pressure low normal, but not greatly different from previous measurements in pregnancy.  Collected CMP and CBC to rule out normotensive preeclampsia.  Urine protein creatinine ratio from yesterday currently pending, will follow all results.  MAU precautions given, map provided.     Fayette Pho, MD [facility]

## 2022-07-31 NOTE — Assessment & Plan Note (Signed)
Modestly improved.  Patient reports improvement overnight after Zofran in clinic and copious fluid intake at home.  Today still endorsing headache and vision changes, although improved from yesterday.  Again, unclear etiology.  Blood pressure low normal, but not greatly different from previous measurements in pregnancy.  Collected CMP and CBC to rule out normotensive preeclampsia.  Urine protein creatinine ratio from yesterday currently pending, will follow all results.  MAU precautions given, map provided.

## 2022-08-01 LAB — COMPREHENSIVE METABOLIC PANEL
ALT: 10 IU/L (ref 0–32)
AST: 13 IU/L (ref 0–40)
Albumin/Globulin Ratio: 1.4 (ref 1.2–2.2)
Albumin: 3.3 g/dL — ABNORMAL LOW (ref 3.9–4.9)
Alkaline Phosphatase: 57 IU/L (ref 44–121)
BUN/Creatinine Ratio: 21 (ref 9–23)
BUN: 12 mg/dL (ref 6–20)
Bilirubin Total: <0.2 mg/dL (ref 0.0–1.2)
CO2: 22 mmol/L (ref 20–29)
Calcium: 8.8 mg/dL (ref 8.7–10.2)
Chloride: 103 mmol/L (ref 96–106)
Creatinine, Ser: 0.57 mg/dL (ref 0.57–1.00)
Globulin, Total: 2.4 g/dL (ref 1.5–4.5)
Glucose: 67 mg/dL — ABNORMAL LOW (ref 70–99)
Potassium: 4.2 mmol/L (ref 3.5–5.2)
Sodium: 136 mmol/L (ref 134–144)
Total Protein: 5.7 g/dL — ABNORMAL LOW (ref 6.0–8.5)
eGFR: 121 mL/min/{1.73_m2} (ref >59)

## 2022-08-01 LAB — CBC
Hematocrit: 35 % (ref 34.0–46.6)
Hemoglobin: 11.7 g/dL (ref 11.1–15.9)
MCH: 31.4 pg (ref 26.6–33.0)
MCHC: 33.4 g/dL (ref 31.5–35.7)
MCV: 94 fL (ref 79–97)
Platelets: 165 10*3/uL (ref 150–450)
RBC: 3.73 x10E6/uL — ABNORMAL LOW (ref 3.77–5.28)
RDW: 12.9 % (ref 11.7–15.4)
WBC: 8.3 10*3/uL (ref 3.4–10.8)

## 2022-08-24 ENCOUNTER — Ambulatory Visit (INDEPENDENT_AMBULATORY_CARE_PROVIDER_SITE_OTHER): Payer: Self-pay | Admitting: Family Medicine

## 2022-08-24 ENCOUNTER — Encounter: Payer: Self-pay | Admitting: Family Medicine

## 2022-08-24 VITALS — BP 90/70 | HR 83 | Ht 61.0 in | Wt 151.2 lb

## 2022-08-24 DIAGNOSIS — Z3483 Encounter for supervision of other normal pregnancy, third trimester: Secondary | ICD-10-CM

## 2022-08-24 LAB — POCT 1 HR PRENATAL GLUCOSE: Glucose 1 Hr Prenatal, POC: 154 mg/dL

## 2022-08-24 NOTE — Progress Notes (Signed)
[facility] Visit  Rebecca Schneider is a 36 y.o. G3P2002 at [redacted]w[redacted]d (via LMP = 8w sono) who presents for routine follow up.   Denies cramping/ctx, fluid leaking, vaginal bleeding, or decreased fetal movement.  Previously had some headache and vision changes, along with dizziness. All of these have resolved. Prior urine culture grew lactobacillus, deemed likely contaminant - denies any difficulty with urination or dysuria.   FHR: 158bpm Uterine size: 29cm  Assessment & Plan  1. Routine prenatal care: - third trim labs done today - 1 hour GTT, CBC, HIV, RPR - letter given to get flu shot and Tdap at [facility], discussed importance of these vaccines to protect her/her baby - assisted patient with logging onto mychart so she can review lab results on there  2. History of anxious mood: previously on sertraline and hydroxyzine, no longer taking and reports is doing well, denies SI/HI - continue to monitor  3. History of precipitous delivery - delivered in car in second pregnancy after no prodromal labor symptoms - encourage going to hospital ASAP at onset of any signs of labor  Next prenatal visit in 2 weeks. Scheduled next 3 visits for patient (2wk & 4wk with Dr. Oleh Genin, 6wk with me in [facility] OB clinic).  Labor & fetal movement precautions discussed.  Chrisandra Netters, MD [place]

## 2022-08-24 NOTE — Patient Instructions (Signed)
 Tercer trimestre de embarazo Third Trimester of Pregnancy  El tercer trimestre de embarazo va desde la semana 28 hasta la semana 40. Esto corresponde a los meses 7 a 9. El tercer trimestre es un perodo en el que el beb en gestacin (feto) crece rpidamente. Hacia el final del noveno mes, el feto mide alrededor de 20 pulgadas (45 cm) de largo y pesa entre 6 y 10 libras (2.7 y 4.5 kg). Cambios en el cuerpo durante el tercer trimestre Durante el tercer trimestre, su cuerpo contina experimentando numerosos cambios. Los cambios varan y generalmente vuelven a la normalidad despus del nacimiento del beb. Cambios fsicos Seguir aumentando de peso. Es de esperar que aumente entre 25 y 35 libras (11 y 16 kg) hacia el final del embarazo si inicia el embarazo con un peso normal. Si tiene bajo peso, es de esperar que aumente entre 28 y 40 libras (13 y 18 kg), y si tiene sobrepeso, es de esperar que aumente entre 15 y 25 libras (7 y 11 kg). Podrn aparecer las primeras estras en las caderas, el abdomen y las mamas. Las mamas seguirn creciendo y pueden doler. Un lquido amarillo (calostro) puede salir de sus pechos. Esta es la primera leche que usted produce para su beb. Tal vez haya cambios en el cabello. Esto cambios pueden incluir su engrosamiento, crecimiento rpido y cambios en la textura. A algunas personas tambin se les cae el cabello durante o despus del embarazo, o tienen el cabello seco o fino. El ombligo puede salir hacia afuera. Puede observar que se le hinchan las manos, el rostro o los tobillos. Cambios en la salud Es posible que tenga acidez estomacal. Puede sufrir estreimiento. Puede desarrollar hemorroides. Puede desarrollar venas hinchadas y abultadas (venas varicosas) en las piernas. Puede presentar ms dolor en la pelvis, la espalda o los muslos. Esto se debe al aumento de peso y al aumento de las hormonas que relajan las articulaciones. Puede presentar un aumento del  hormigueo o entumecimiento en las manos, brazos y piernas. La piel de su abdomen tambin puede sentirse entumecida. Puede sentir que le falta el aire debido a que se expande el tero. Otros cambios Puede tener necesidad de orinar con ms frecuencia porque el feto baja hacia la pelvis y ejerce presin sobre la vejiga. Puede tener ms problemas para dormir. Esto puede deberse al tamao de su abdomen, una mayor necesidad de orinar y un aumento en el metabolismo de su cuerpo. Puede notar que el feto "baja" o lo siente ms bajo, en el abdomen (aligeramiento). Puede tener un aumento de la secrecin vaginal. Puede notar que tiene dolor alrededor del hueso plvico a medida que el tero se distiende. Siga estas instrucciones en su casa: Medicamentos Siga las instrucciones del mdico en relacin con el uso de medicamentos. Durante el embarazo, hay medicamentos que pueden tomarse y otros que no. No tome ningn medicamento a menos que lo haya autorizado el mdico. Tome vitaminas prenatales que contengan por lo menos 600 microgramos (mcg) de cido flico. Comida y bebida Lleve una dieta saludable que incluya frutas y verduras frescas, cereales integrales, buenas fuentes de protenas como carnes magras, huevos o tofu, y productos lcteos descremados. Evite la carne cruda y el jugo, la leche y el queso sin pasteurizar. Estos portan grmenes que pueden provocar dao tanto a usted como al beb. Tome 4 o 5 comidas pequeas en lugar de 3 comidas abundantes al da. Es posible que tenga que tomar estas medidas para prevenir o tratar   el estreimiento: Beber suficiente lquido como para mantener la orina de color amarillo plido. Consumir alimentos ricos en fibra, como frijoles, cereales integrales, y frutas y verduras frescas. Limitar el consumo de alimentos ricos en grasa y azcares procesados, como los alimentos fritos o dulces. Actividad Haga ejercicio solamente como se lo haya indicado el mdico. La mayora de  las personas pueden continuar su actividad fsica habitual durante el embarazo. Intente realizar como mnimo 30 minutos de actividad fsica por lo menos 5 das a la semana. Deje de hacer ejercicio si experimenta contracciones en el tero. Deje de hacer ejercicio si le aparecen dolor o clicos en la parte baja del vientre o de la espalda. Evite levantar pesos excesivos. No haga ejercicio si hace mucho calor o humedad, o si se encuentra a una altitud elevada. Si lo desea, puede seguir teniendo relaciones sexuales, salvo que el mdico le indique lo contrario. Alivio del dolor y del malestar Haga pausas frecuentes y descanse con las piernas levantadas (elevadas) si tiene calambres en las piernas o dolor en la parte baja de la espalda. Dese baos de asiento con agua tibia para aliviar el dolor o las molestias causadas por las hemorroides. Use una crema para las hemorroides si el mdico la autoriza. Use un sujetador que le brinde buen soporte para prevenir las molestias causadas por la sensibilidad en las mamas. Si tiene venas varicosas: Use medias de compresin como se lo haya indicado el mdico. Eleve los pies durante 15 minutos, 3 o 4 veces por da. Limite el consumo de sal en su dieta. Seguridad Hable con su mdico antes de viajar distancias largas. No se d baos de inmersin en agua caliente, baos turcos ni saunas. Use el cinturn de seguridad en todo momento mientras conduce o va en auto. Hable con el mdico si es vctima de maltrato verbal o fsico. Preparacin para el nacimiento Para prepararse para la llegada de su beb: Tome clases prenatales para entender, practicar, y hacer preguntas sobre el trabajo de parto y el parto. Visite el hospital y recorra el rea de maternidad. Compre un asiento de seguridad orientado hacia atrs, y asegrese de saber cmo instalarlo en su automvil. Prepare la habitacin o el lugar donde dormir el beb. Asegrese de quitar todas las almohadas y animales de  peluche de la cuna del beb para evitar la asfixia. Indicaciones generales Evite el contacto con las bandejas sanitarias de los gatos y la tierra que estos animales usan. Estos alimentos contienen bacterias que pueden causar defectos congnitos en el beb. Si tiene un gato, pdale a alguien que limpie la caja de arena por usted. No se haga lavados vaginales ni use tampones. No use toallas higinicas perfumadas. No consuma ningn producto que contenga nicotina o tabaco, como cigarrillos, cigarrillos electrnicos y tabaco de mascar. Si necesita ayuda para dejar de consumir estos productos, consulte al mdico. No use ningn remedio a base de hierbas, drogas ilegales o medicamentos que no le hayan sido recetados. Las sustancias qumicas de estos productos pueden daar al beb. No beba alcohol. Le realizarn exmenes prenatales ms frecuentes durante el tercer trimestre. Durante una visita prenatal de rutina, el mdico le har un examen fsico, le realizar pruebas y hablar con usted de su salud general. Cumpla con todas las visitas de seguimiento. Esto es importante. Dnde buscar ms informacin American Pregnancy Association (Asociacin Estadounidense del Embarazo): americanpregnancy.org American College of Obstetricians and Gynecologists (Colegio Estadounidense de Obstetras y Gineclogos): acog.org/womens-health/pregnancy? Office on Women's Health (Oficina para la Salud de   la Mujer): womenshealth.gov/pregnancy Comunquese con un mdico si tiene: Fiebre. Clicos leves en la pelvis, presin en la pelvis o dolor persistente en la zona abdominal o la parte baja de la espalda. Vmitos o diarrea. Secrecin vaginal con mal olor u orina con mal olor. Dolor al orinar. Un dolor de cabeza que no desaparece despus de tomar analgsicos. Cambios en la visin o ve manchas delante de los ojos. Solicite ayuda de inmediato si: Rompe la bolsa. Tiene contracciones regulares separadas por menos de 5 minutos. Tiene  sangrado o pequeas prdidas vaginales. Siente un dolor abdominal intenso. Tiene dificultad para respirar. Siente dolor en el pecho. Sufre episodios de desmayo. No ha sentido a su beb moverse durante el perodo de tiempo que le indic el mdico. Tiene dolor, hinchazn o enrojecimiento nuevos en un brazo o una pierna o se produce un aumento de alguno de estos sntomas. Resumen El tercer trimestre del embarazo comprende desde la semana 28 hasta la semana 40 (desde el mes 7 hasta el mes 9). Puede tener ms problemas para dormir. Esto puede deberse al tamao de su abdomen, una mayor necesidad de orinar y un aumento en el metabolismo de su cuerpo. Le realizarn exmenes prenatales ms frecuentes durante el tercer trimestre. Cumpla con todas las visitas de seguimiento. Esto es importante. Esta informacin no tiene como fin reemplazar el consejo del mdico. Asegrese de hacerle al mdico cualquier pregunta que tenga. Document Revised: 05/24/2020 Document Reviewed: 05/24/2020 Elsevier Patient Education  2023 Elsevier Inc.  

## 2022-08-25 ENCOUNTER — Other Ambulatory Visit: Payer: Self-pay | Admitting: *Deleted

## 2022-08-25 DIAGNOSIS — Z3483 Encounter for supervision of other normal pregnancy, third trimester: Secondary | ICD-10-CM

## 2022-08-25 LAB — HIV ANTIBODY (ROUTINE TESTING W REFLEX): HIV Screen 4th Generation wRfx: NONREACTIVE

## 2022-08-25 LAB — CBC
Hematocrit: 34.9 % (ref 34.0–46.6)
Hemoglobin: 11.4 g/dL (ref 11.1–15.9)
MCH: 30.5 pg (ref 26.6–33.0)
MCHC: 32.7 g/dL (ref 31.5–35.7)
MCV: 93 fL (ref 79–97)
Platelets: 158 10*3/uL (ref 150–450)
RBC: 3.74 x10E6/uL — ABNORMAL LOW (ref 3.77–5.28)
RDW: 12.4 % (ref 11.7–15.4)
WBC: 7.1 10*3/uL (ref 3.4–10.8)

## 2022-08-25 LAB — RPR: RPR Ser Ql: NONREACTIVE

## 2022-08-28 ENCOUNTER — Other Ambulatory Visit (INDEPENDENT_AMBULATORY_CARE_PROVIDER_SITE_OTHER): Payer: Self-pay

## 2022-08-28 DIAGNOSIS — O219 Vomiting of pregnancy, unspecified: Secondary | ICD-10-CM

## 2022-08-28 DIAGNOSIS — Z3483 Encounter for supervision of other normal pregnancy, third trimester: Secondary | ICD-10-CM

## 2022-08-28 LAB — POCT CBG (FASTING - GLUCOSE)-MANUAL ENTRY: Glucose Fasting, POC: 96 mg/dL (ref 70–99)

## 2022-08-28 MED ORDER — ONDANSETRON 4 MG PO TBDP
4.0000 mg | ORAL_TABLET | Freq: Once | ORAL | Status: AC
  Administered 2022-08-28: 4 mg via ORAL

## 2022-08-28 NOTE — Progress Notes (Signed)
Patient became sick during 3 hr gtt, test cancelled per protocol. Messaged provider to call in zofran to take 1 hr prior to coming back on Monday for second attempt. Jaymes Graff Carder Yin

## 2022-08-31 ENCOUNTER — Other Ambulatory Visit (INDEPENDENT_AMBULATORY_CARE_PROVIDER_SITE_OTHER): Payer: Self-pay

## 2022-08-31 DIAGNOSIS — Z3483 Encounter for supervision of other normal pregnancy, third trimester: Secondary | ICD-10-CM

## 2022-08-31 DIAGNOSIS — O219 Vomiting of pregnancy, unspecified: Secondary | ICD-10-CM

## 2022-08-31 LAB — POCT CBG (FASTING - GLUCOSE)-MANUAL ENTRY: Glucose Fasting, POC: 91 mg/dL (ref 70–99)

## 2022-08-31 MED ORDER — ONDANSETRON 4 MG PO TBDP
4.0000 mg | ORAL_TABLET | Freq: Once | ORAL | Status: AC
  Administered 2022-08-31: 4 mg via ORAL

## 2022-09-01 LAB — GESTATIONAL GLUCOSE TOLERANCE
Glucose, Fasting: 80 mg/dL (ref 70–94)
Glucose, GTT - 1 Hour: 164 mg/dL (ref 70–179)
Glucose, GTT - 2 Hour: 163 mg/dL — ABNORMAL HIGH (ref 70–154)
Glucose, GTT - 3 Hour: 88 mg/dL (ref 70–139)

## 2022-09-07 ENCOUNTER — Ambulatory Visit (INDEPENDENT_AMBULATORY_CARE_PROVIDER_SITE_OTHER): Payer: Self-pay | Admitting: Family Medicine

## 2022-09-07 VITALS — BP 95/62 | HR 82 | Wt 152.2 lb

## 2022-09-07 DIAGNOSIS — O2243 Hemorrhoids in pregnancy, third trimester: Secondary | ICD-10-CM

## 2022-09-07 DIAGNOSIS — O26893 Other specified pregnancy related conditions, third trimester: Secondary | ICD-10-CM

## 2022-09-07 DIAGNOSIS — M545 Low back pain, unspecified: Secondary | ICD-10-CM

## 2022-09-07 DIAGNOSIS — Z3483 Encounter for supervision of other normal pregnancy, third trimester: Secondary | ICD-10-CM

## 2022-09-07 NOTE — Patient Instructions (Addendum)
Hemorrhoids - Use Miralax and metamucil to help soften your stools.  You will need to make sure to stay hydrated as well as keep an adequate amount of fiber in your diet.  You can also use Preparation H (which you can get in a pharmacy) to help with the pain.  How to Decrease SI Joint Pain During Pregnancy As your pregnancy progresses and you gain more weight as the baby grows, SI joint pain can worsen. If you don't want to suffer from this pain throughout a nine-month pregnancy, here are some tips that may help:  Note: always check with your OB/GYN before beginning any new exercises during pregnancy.   Invest in a maternity support belt A maternity support belt is designed to relieve pressure from your SI joint, as well as to help you improve your posture. You can wear one whenever you're walking or doing other light activities.   Do pelvic tilts This exercise is excellent for helping to decompress the SI joints, especially when done multiple times a day. Pelvic tilts also strengthen your lower back muscles in order to help with pain relief and stabilization. To perform this exercise, lie on the floor with your back straight up and your head facing the ceiling. Next, bend your knees and place your feet flat on the floor. Then, tilt your pelvis backwards as if you were sucking in your stomach. Hold for a few seconds and then release it back to a neutral position.  Perform banded hip abduction exercises The muscles that are closest to the SI joint are the obturator internus and externus. If they become shortened, then the SI joint must compensate for that tightness. These exercises can help lengthen and reinforce those muscles. To perform this exercise, place the resistance band around your knees and sit at the edge of your chair. With your feet on the floor, separate your knees slowly to apply tension on the band. Hold the pose for about three seconds, and then gradually return to your initial  position.  Hold a squat position for 30 seconds each day When standing, your pelvis should always be in a neutral position. However, during pregnancy,  women actively extend their spine to support the fetus, which moves their pelvis forward. Holding a squat for half a minute each day will help reset the pelvic bones, so they are all in the correct place.      Hemorroides: use Miralax y metamucil para ayudar a ablandar las heces. Deber asegurarse de [employer] hidratado y de mantener una cantidad [place] de fibra en su dieta. Tambin puede [employer] Preparacin H (que puede conseguir en la farmacia) para Best boy.  Cmo disminuir el dolor en la articulacin sacroilaca durante el embarazo A medida que avanza su embarazo y usted gana ms peso a medida que crece el beb, Conservation officer, historic buildings en la articulacin Economist. Si no quieres sufrir [employer] nueve meses de [place], aqu tienes algunos consejos que te pueden ayudar:  Nota: siempre consulte con su obstetra/gineclogo antes de comenzar cualquier ejercicio nuevo durante el [place].  Invierta en un cinturn de soporte de maternidad Un cinturn de soporte de maternidad est diseado para Human resources officer presin de la articulacin SI, as como para ayudarla a mejorar su postura. Puede usar uno siempre que camine o realice otras actividades ligeras.  Hacer inclinaciones plvicas Este ejercicio es excelente para ayudar a descomprimir las articulaciones SI, especialmente cuando se realiza varias veces al [employer]. Las inclinaciones plvicas  tambin fortalecen los msculos de la espalda baja para ayudar a Paramedic y Charity fundraiser. Para realizar este ejercicio, acustate en el suelo con la espalda erguida y la cabeza mirando al techo. Luego, doble las rodillas y [employer] pies apoyados en el suelo. Luego, inclina la pelvis hacia atrs como si estuvieras succionando el estmago. Mantngalo presionado durante unos segundos  y luego sultelo nuevamente a una posicin neutral.  Realizar ejercicios de abduccin de cadera con banda. Los msculos que estn ms cerca de la articulacin SI son el obturador interno y externo. Si se acortan, entonces la articulacin SI debe compensar esa tensin. Estos ejercicios pueden ayudar a Microbiologist y reforzar esos msculos. Para realizar este ejercicio, coloque la banda de resistencia alrededor de sus rodillas y sintese en el borde de su silla. Con los pies en el suelo, separa las rodillas lentamente para aplicar tensin en la banda. Mantn la postura durante unos tres segundos y luego regresa gradualmente a tu posicin inicial.  Mantenga una posicin en cuclillas durante 30 segundos cada da. Al estar de pie, la pelvis debe estar siempre en una posicin neutra. Sin embargo, durante el [place], las mujeres extienden activamente la columna para sostener al feto, lo que hace que la pelvis avance. Mantener una sentadilla durante medio minuto cada da ayudar a [employer] de la pelvis, para que estn todos en Financial controller.    Precauciones de devolucin relacionadas con Reginia Naas  Los siguientes son signos/sntomas que son anormales en el Psychiatrist y pueden requerir una evaluacin adicional por parte de un mdico: Vaya a MAU en [facility] en [place] si: Tiene calambres/contracciones que no desaparecen con agua potable, especialmente si duran de 30 segundos a 1,5 minutos, aparecen y desaparecen cada 5-10 minutos durante una hora o ms, o si son cada vez ms fuertes y no puede caminar o Physiological scientist. Tu agua se rompe. A veces es un gran chorro de lquido, a veces es solo un goteo que sigue mojando tu ropa interior o corriendo por tus piernas. Tiene sangrado vaginal. No siente que su beb se mueva normalmente. Si no lo hace, busque algo para comer y beber (algo fro o algo con azcar [employer] de man o [place]) y  acustese y concntrese en sentir cmo se mueve su beb. Si su beb todava no se mueve con normalidad, debe ir a MAU. Debe sentir que su beb se mueve 6 veces en una hora o 10 veces en dos horas. Tiene un dolor de cabeza persistente que no desaparece con 1 g de Tylenol, cambios en la visin, dolor en el pecho, dificultad para respirar, dolor intenso en la parte superior derecha del abdomen, empeoramiento de la hinchazn de las piernas; todos estos pueden ser signos de presin arterial alta en el embarazo y necesita para ser evaluado por un proveedor inmediatamente  [place] son preocupantes durante el Psychiatrist y, si tiene alguno de [place], le recomiendo que llame a su PCP y se presente a Furniture conservator/restorer de Admisiones de Maternidad (mapa a continuacin) para Hotel manager.  Para cualquier emergencia relacionada con Firefighter, dirjase a la [place] de Admisiones de Maternidad en [address] de [place] y [facility]. Usar la Entrada C del hospital.

## 2022-09-07 NOTE — Progress Notes (Signed)
  [place] Prenatal Visit  Rebecca Schneider is a 36 y.o. G3P2002 at [redacted]w[redacted]d here for routine follow up. She is dated by LMP.  She reports backache (sacral pain and couldn't sleep), muscle aches in calves, thighs, and feet.  She reports fetal movement. She denies vaginal bleeding, contractions, or loss of fluid.  See flow sheet for details.  Vitals:   09/07/22 1355  BP: 95/62  Pulse: 82     A/P: Pregnancy at [redacted]w[redacted]d.  Doing well.   Routine prenatal care:  Dating reviewed, dating tab is correct Fetal heart tones: Appropriate Fundal height: within expected range.  The patient does not have a history of HSV and valacyclovir is not indicated at this time.  The patient does not have a history of Cesarean delivery and no referral to [facility] is indicated Infant feeding choice: Breastfeeding Contraception choice: condoms Infant circumcision desired no Influenza and Tdap vaccine letters given at last visit  Pregnancy education regarding benefits of breastfeeding, contraception, fetal growth, expected weight gain, and safe infant sleep were discussed.  Preterm labor and fetal movement precautions reviewed.   2. Pregnancy issues include the following and were addressed as appropriate today: History of anxious mood - previously on sertraline and hydroxyzine, no longer taking and is doing well Low back/SI joint pain - discussed exercises and stretches that can help. Recommend Tylenol if pain interfering with sleep Hemorrhoids - recommended preparation H and Miralax or metamucil with hydration to ensure softer stools and less straining History of precipitous labor Problem list and pregnancy box updated: Yes.  .   Follow up 2 weeks.

## 2022-09-24 ENCOUNTER — Encounter: Payer: Self-pay | Admitting: Family Medicine

## 2022-09-24 ENCOUNTER — Ambulatory Visit (INDEPENDENT_AMBULATORY_CARE_PROVIDER_SITE_OTHER): Payer: Self-pay | Admitting: Family Medicine

## 2022-09-24 VITALS — BP 94/52 | HR 84 | Wt 155.0 lb

## 2022-09-24 DIAGNOSIS — Z3483 Encounter for supervision of other normal pregnancy, third trimester: Secondary | ICD-10-CM

## 2022-09-24 NOTE — Progress Notes (Signed)
  [place] Prenatal Visit  Rebecca Schneider is a 36 y.o. G3P2002 at [redacted]w[redacted]d here for routine follow up. She is dated by LMP.  She reports fatigue and intermittent, irregular contractions.  She reports fetal movement. She denies vaginal bleeding, or loss of fluid. See flow sheet for details.  Vitals:   09/24/22 1339  BP: (!) 94/52  Pulse: 84     A/P: Pregnancy at [redacted]w[redacted]d.  Doing well.   Routine prenatal care:  Dating reviewed, dating tab is correct Fetal heart tones: Appropriate Fundal height: within expected range.  The patient does not have a history of HSV and valacyclovir is not indicated at this time.  The patient does not have a history of Cesarean delivery and no referral to [facility] is indicated Infant feeding choice: Breastfeeding Contraception choice: condoms Infant circumcision desired no Influenza and Tdap have not yet been done, reprinted [facility] Pregnancy education regarding benefits of breastfeeding, contraception, fetal growth, expected weight gain, and safe infant sleep were discussed.  Preterm labor and fetal movement precautions reviewed.   2. Pregnancy issues include the following and were addressed as appropriate today: HX of anxious mood, stable not requiring medications Low back/SI joint: still present, doing the exercises and stretches as previously discussed.  Hemorrhoids: intermittently having them Vaccines: reprinted letter for Tdap and Flu (pt reports she doesn't want flu shot but included it in case she changes her mind) Problem list and pregnancy box updated: Yes.   Scheduled for [facility] in third trimester on 10/08/2022.   Follow up 2 weeks.

## 2022-09-24 NOTE — Patient Instructions (Signed)
Precauciones de devolución relacionadas con el embarazo ° °Los siguientes son signos/síntomas que son anormales en el embarazo y pueden requerir una evaluación adicional por parte de un médico: °Vaya a MAU en [facility] [place] si: °Tiene calambres/contracciones que no desaparecen con agua potable, especialmente si duran de 30 segundos a 1,5 minutos, aparecen y desaparecen cada 5-10 minutos durante una hora o más, o si son cada vez más fuertes y no puede caminar o hablar mientras tener una contracción/calambre. °Tu agua se rompe. A veces es un gran chorro de líquido, a veces es solo un goteo que sigue mojando tu ropa interior o corriendo por tus piernas. °Tiene sangrado vaginal. °No siente que su bebé se mueva normalmente. Si no lo hace, busque algo para comer y beber (algo frío o algo con azúcar como mantequilla de maní o jugo) y acuéstese y concéntrese en sentir cómo se mueve su bebé. Si su bebé todavía no se mueve con normalidad, debe ir a MAU. Debe sentir que su bebé se mueve 6 veces en una hora o 10 veces en dos horas. °Tiene un dolor de cabeza persistente que no desaparece con 1 g de Tylenol, cambios en la visión, dolor en el pecho, dificultad para respirar, dolor intenso en la parte superior derecha del abdomen, empeoramiento de la hinchazón de las piernas; todos estos pueden ser signos de presión arterial alta en el embarazo y necesita para ser evaluado por un proveedor inmediatamente ° °Todos estos son preocupantes durante el embarazo y, si tiene alguno de estos, le recomiendo que llame a su PCP y se presente a la Unidad de Admisiones de Maternidad (mapa a continuación) para una evaluación adicional. ° °Para cualquier emergencia relacionada con el embarazo, diríjase a la Unidad de Admisiones de Maternidad en el [facility]. Usará la Entrada C del hospital. ° °  °

## 2022-10-08 ENCOUNTER — Other Ambulatory Visit: Payer: Self-pay

## 2022-10-08 ENCOUNTER — Ambulatory Visit (INDEPENDENT_AMBULATORY_CARE_PROVIDER_SITE_OTHER): Payer: Self-pay | Admitting: Family Medicine

## 2022-10-08 VITALS — BP 95/55 | HR 92 | Wt 156.0 lb

## 2022-10-08 DIAGNOSIS — Z3483 Encounter for supervision of other normal pregnancy, third trimester: Secondary | ICD-10-CM

## 2022-10-08 NOTE — Progress Notes (Signed)
  [facility] Prenatal Visit  Rebecca Schneider is a 36 y.o. G3P2002 at [redacted]w[redacted]d for routine follow up.  She reports her legs have been numb. She denies urinary or fecal incontinence. She also endorses cramping in her legs and pelvis. She says that she has pain when she moves around and this makes her very tired. She endorses that this feels like the regular aches and pains of pregnancy. She denies contractions. She reports fetal movement. She denies vaginal bleeding, contractions, or loss of fluid.   See flow sheet for details.  Vitals:   10/08/22 0904  BP: (!) 95/55  Pulse: 92     A/P: Pregnancy at [redacted]w[redacted]d.  Doing well.   Routine prenatal care:  Infant feeding choice: Breast Contraception choice Condoms  Infant circumcision desired no Tdapwas not given today. Discussed flu and [facility]ap at [facility]. Has letter to get vaccines.  COVID vaccination was discussed and patient declines. .  GBS/GC/CZ testing was not performed today. Will be done at next visit.  Preterm labor precautions reviewed. Safe sleep discussed. Kick counts reviewed. Preterm labor precautions reviewed. Safe sleep discussed. Kick counts reviewed.  2. Pregnancy issues include the following and were addressed as appropriate today:  Fatigue - Hgb has been normal, patient has not been sleeping much due to aches and pains from third trimester pregnancy. Her son has also been in the hospital and got a surgery for his kidney recently, so she has been very busy and stressed. Discussed using supportive belly band.   Problem list and pregnancy box updated: Yes.   Follow up in 2 weeks.

## 2022-10-15 ENCOUNTER — Other Ambulatory Visit (HOSPITAL_COMMUNITY)
Admission: RE | Admit: 2022-10-15 | Discharge: 2022-10-15 | Disposition: A | Discharge status: Home / Self Care | Payer: Self-pay | Source: Ambulatory Visit | Attending: Family Medicine | Admitting: Family Medicine

## 2022-10-15 ENCOUNTER — Ambulatory Visit (INDEPENDENT_AMBULATORY_CARE_PROVIDER_SITE_OTHER): Payer: Self-pay | Admitting: Student

## 2022-10-15 ENCOUNTER — Encounter: Payer: Self-pay | Admitting: Student

## 2022-10-15 VITALS — BP 95/65 | HR 90 | Wt 156.1 lb

## 2022-10-15 DIAGNOSIS — K5909 Other constipation: Secondary | ICD-10-CM

## 2022-10-15 DIAGNOSIS — Z3A37 37 weeks gestation of pregnancy: Secondary | ICD-10-CM | POA: Insufficient documentation

## 2022-10-15 DIAGNOSIS — Z3483 Encounter for supervision of other normal pregnancy, third trimester: Secondary | ICD-10-CM | POA: Insufficient documentation

## 2022-10-15 MED ORDER — DOCUSATE SODIUM 50 MG PO CAPS: 50.0000 mg | ORAL_CAPSULE | Freq: Two times a day (BID) | ORAL | 0 refills | Status: DC

## 2022-10-15 NOTE — Progress Notes (Signed)
  [facility] Prenatal Visit  Rebecca Schneider is a 36 y.o. G3P2002 at [redacted]w[redacted]d here for routine follow up. She is dated by LMP.  She reports no complaints. She reports fetal movement. She denies vaginal bleeding, contractions, or loss of fluid. See flow sheet for details.  Patient is still complaining of difficulty with constipation.   She reports that she may have a couple of contractions every once in a while.   Vitals:   10/15/22 0921  BP: 95/65  Pulse: 90    A/P: Pregnancy at [redacted]w[redacted]d.  Doing well.   Routine prenatal care  Dating reviewed, dating tab is correct Fetal heart tones Appropriate Fundal height within expected range.  Fetal position confirmed Vertex using Ultrasound .  GBS collected today. .  Repeat GC/CT collected today.  The patient does not have a history of HSV and valacyclovir is not indicated at this time.  Infant feeding choice: Breastfeeding Contraception choice: POPs  Infant circumcision desired: no Influenza vaccine and TDAP vaccine letters provided previously  COVID vaccination was discussed and patient has declined.  Pregnancy education regarding preterm labor, fetal movement,  benefits of breastfeeding, contraception, fetal growth, expected weight gain, and safe infant sleep were discussed.    2. Pregnancy issues include the following and were addressed as appropriate today:  Anxiety: No mood complications today, no medications   Constipation: MiraLAX and Colace sent to the pharmacy. Continue with high fiber diet   Precipitous delivery: Monitor closely as we reach delivery  Discussed that we recommend TDAP and flu, previously given letter for [facility]   Return in 1 week   Needs OB faculty visit third trimester if not already done     Problem list and pregnancy box updated: Yes.  Follow up 1 week.

## 2022-10-15 NOTE — Patient Instructions (Signed)
It was great to see you today! Thank you for choosing [facility] for your obstetric care. Rebecca Schneider was seen for ob visit.   Please take the Colace twice daily with the miralax  [place] con miralax.  We will continue seeing you once weekly now!  We will be checking GBS swabs and gonorrhea and chlamydia testing today or in the coming couple of weeks, will call or message with these results  Your birth plan is very important to [name], please give [name] details as they change  You can start kick counting as well, should feel 3-4 kicks in 1 hour or 10 in 2 hours. Only do this if you have become concerned that baby is not moving   Newborn Information  baby needs to sleep in bassinet after birth, with tight sheet that is fitted and no stuffed animals or other item in the area to prevent suffocation, try to avoid others kissing baby and inform them of shaken baby syndrome concerns , Fed is best!! they should never go more than 3 hours in the day or 4 hours at night without feeding , We will talk about sick care once baby is born , and hepatitis B vaccine is highly recommended after birth and will be offered to you   Te envo una vez a la semana ahora! Estaremos revisando hisopos de GBS y [place] de [place] y clamidia hoy o en las prximas semanas, llamaremos o enviaremos mensajes con [place]. Su plan de parto es muy importante para nosotros, por favor proporcinenos los detalles a medida que [place]. Tambin puedes empezar a contar patadas; deberas sentir de 3 a 4 patadas en 1 hora o 10 en 2 horas. Haga esto solo si le preocupa que el beb no se mueva.  Informacin del recin nacido El beb necesita dormir en un moiss despus del nacimiento, con una sbana [place] y sin animales de peluche u otros elementos en el rea para Mining engineer asfixia, trate de evitar que otras personas besen al beb e infrmeles sobre las preocupaciones sobre el sndrome del  beb sacudido. Alimentarlo es lo mejor!! nunca deben pasar ms de 3 horas en el da o 4 horas en la noche sin alimentarse. Hablaremos sobre el cuidado de los enfermos una vez que nazca el beb, y la vacuna contra la hepatitis B es muy recomendable despus del nacimiento y se la ofreceremos a usted.  Call your OB Clinic or go to [facility] if: You begin to have strong, frequent contractions Your water breaks.  Sometimes it is a big gush of fluid, sometimes it is just a trickle that keeps getting your panties wet or running down your legs You have vaginal bleeding.  It is normal to have a small amount of spotting if your cervix was checked.  You don't feel your baby moving like normal.  If you don't, get you something to eat and drink and lay down and focus on feeling your baby move.  You should feel at least 10 movements in 2 hours.  If you don't, you should call the office or go to [facility].   If you haven't already, sign up for My Chart to have easy access to your labs results, and communication with your primary care physician.  I recommend that you always bring your medications to each appointment as this makes it easy to ensure you are on the correct medications and helps [name] not miss refills when  you need them.  Please arrive 15 minutes before your appointment to ensure smooth check in process.  We appreciate your efforts in making this happen.  Please call the clinic at [phone] if your symptoms worsen or you have any concerns.  Thank you for allowing me to participate in your care, Rebecca Martinez, MD 10/15/2022, 10:05 AM PGY-2, [facility]   If you haven't already, sign up for My Chart to have easy access to your labs results, and communication with your primary care physician.  We are checking some labs today. If they are abnormal, I will call you. If they are normal, I will send you a MyChart message (if it is active) or a letter in the mail. If you  do not hear about your labs in the next 2 weeks, please call the office. I recommend that you always bring your medications to each appointment as this makes it easy to ensure you are on the correct medications and helps [name] not miss refills when you need them. Call the clinic at [phone] if your symptoms worsen or you have any concerns.   Please arrive 15 minutes before your appointment to ensure smooth check in process.  We appreciate your efforts in making this happen.  Thank you for allowing me to participate in your care, Rebecca Martinez, MD 10/15/2022, 10:05 AM PGY-2, [facility]

## 2022-10-16 LAB — CERVICOVAGINAL ANCILLARY ONLY
Chlamydia: NEGATIVE
Comment: NEGATIVE
Comment: NORMAL
Neisseria Gonorrhea: NEGATIVE

## 2022-10-19 ENCOUNTER — Encounter: Payer: Self-pay | Admitting: Student

## 2022-10-20 LAB — CULTURE, BETA STREP (GROUP B ONLY): Strep Gp B Culture: NEGATIVE

## 2022-10-21 ENCOUNTER — Encounter: Payer: Self-pay | Admitting: Student

## 2022-10-21 ENCOUNTER — Ambulatory Visit (INDEPENDENT_AMBULATORY_CARE_PROVIDER_SITE_OTHER): Payer: Self-pay | Admitting: Student

## 2022-10-21 VITALS — BP 94/60 | HR 89 | Temp 98.8°F | Wt 155.2 lb

## 2022-10-21 DIAGNOSIS — Z8759 Personal history of other complications of pregnancy, childbirth and the puerperium: Secondary | ICD-10-CM

## 2022-10-21 DIAGNOSIS — Z3483 Encounter for supervision of other normal pregnancy, third trimester: Secondary | ICD-10-CM

## 2022-10-21 NOTE — Patient Instructions (Addendum)
It was great to see you today! Thank you for choosing [facility] for your obstetric care. Rebecca Schneider was seen for ob visit.  Puedes usar Aquaphor o vasolina en tus pezones todas las noches.  Continuaremos vindote una vez por semana ahora! Su plan de parto es muy importante para nosotros, por favor proporcinenos los [place] a medida que [place]. Tambin puedes empezar a contar patadas; deberas sentir de 3 a 4 patadas en 1 hora o 10 en 2 horas. Haga esto solo si le preocupa que el beb no se mueva.  Informacin del recin nacido El beb necesita dormir en un moiss despus del nacimiento, con una sbana [place] y sin animales de peluche u otros elementos en el rea para Transport planner asfixia, trate de evitar que otras personas besen al beb e infrmeles sobre las preocupaciones sobre el sndrome del beb sacudido. Alimentarlo es lo mejor!! nunca deben pasar ms de 3 horas en el da o 4 horas en la noche sin alimentarse, y [employer] cuidados de los enfermos una vez que nazca el beb.  Llame a su clnica de obstetricia o vaya al [facility] si: ? Comienzas a tener contracciones fuertes y frecuentes. ? Se te rompe fuente. A veces es un gran chorro de lquido, a veces es solo un chorrito que sigue mojando tus bragas o corriendo por tus piernas. ? Tienes sangrado vaginal. Es normal tener un poco de sangrado si le revisaron el cuello uterino. ? No siente que su beb se mueve normalmente. Si no lo hace, consgale algo de comer y beber, recustese y concntrese en sentir cmo se mueve su beb. Deberas sentir al menos 10 movimientos en 2 horas. Si no lo hace, debe llamar al consultorio o ir al [facility].  You can use Aquaphor or vasoline on your nipples nightly   We will continue seeing you once weekly now!  Your birth plan is very important to [name], please give [name] details as they change  You can start kick counting as well, should feel 3-4 kicks in 1 hour  or 10 in 2 hours. Only do this if you have become concerned that baby is not moving   Newborn Information  baby needs to sleep in bassinet after birth, with tight sheet that is fitted and no stuffed animals or other item in the area to prevent suffocation, try to avoid others kissing baby and inform them of shaken baby syndrome concerns , Fed is best!! they should never go more than 3 hours in the day or 4 hours at night without feeding , and We will talk about sick care once baby is born   Call your OB Clinic or go to [facility] if: You begin to have strong, frequent contractions Your water breaks.  Sometimes it is a big gush of fluid, sometimes it is just a trickle that keeps getting your panties wet or running down your legs You have vaginal bleeding.  It is normal to have a small amount of spotting if your cervix was checked.  You don't feel your baby moving like normal.  If you don't, get you something to eat and drink and lay down and focus on feeling your baby move.  You should feel at least 10 movements in 2 hours.  If you don't, you should call the office or go to [facility].   If you haven't already, sign up for My Chart to have easy access to your labs results, and communication  with your primary care physician.  You should return to our clinic Return in about 1 week (around 10/28/2022).  I recommend that you always bring your medications to each appointment as this makes it easy to ensure you are on the correct medications and helps [name] not miss refills when you need them.  Please arrive 15 minutes before your appointment to ensure smooth check in process.  We appreciate your efforts in making this happen.  Please call the clinic at [phone] if your symptoms worsen or you have any concerns.  Thank you for allowing me to participate in your care, Alfredo Martinez, MD 10/21/2022, 9:54 AM PGY-2, [facility]

## 2022-10-21 NOTE — Progress Notes (Signed)
  [facility] Prenatal Visit  Rebecca Schneider is a 36 y.o. G3P2002 at [redacted]w[redacted]d here for routine follow up. She is dated by LMP.  She reports some nipple itching. She reports fetal movement. She denies vaginal bleeding, contractions, or loss of fluid. See flow sheet for details  Itching in the nipples. She does note moles in the area, itchy in nature. She also has some scabbing around her nipples. No bleeding or fever/chills.   Vitals:   10/21/22 0920  BP: 94/60  Pulse: 89  Temp: 98.8 F (37.1 C)    A/P: Pregnancy at [redacted]w[redacted]d.  Doing well.   Routine prenatal care  Dating reviewed, dating tab is correct Fetal heart tones Appropriate--138  Fundal height within expected range.  Fetal position confirmed Vertex using Ultrasound .  GBS previously obtained. Negative.  Repeat GC/CT previously obtained and negative  The patient does not have a history of HSV and valacyclovir is not indicated at this time.  Infant feeding choice: Breastfeeding Contraception choice: Pills  Infant circumcision desired no Influenza vaccine: previously given [facility] letter; patient will make an appt  Tdap previously given [facility] letter; patient will make an appt  COVID vaccination was discussed and declined.  Pregnancy education regarding preterm labor, fetal movement, benefits of breastfeeding, contraception, fetal growth, expected weight gain, and safe infant sleep were discussed.    2. Pregnancy issues include the following and were addressed as appropriate today:   Anxiety: No mood complications today, no medications              Constipation: MiraLAX and Colace. Continue with high fiber diet              Precipitous delivery: Monitor closely as we reach delivery             Discussed that we recommend TDAP and flu, previously given letter for [facility]   Nipple itching: continue with Aquaphor or Vasoline nightly   Problem list and pregnancy box updated: Yes.  Follow up 1 week.

## 2022-10-28 ENCOUNTER — Ambulatory Visit (INDEPENDENT_AMBULATORY_CARE_PROVIDER_SITE_OTHER): Payer: Self-pay | Admitting: Student

## 2022-10-28 ENCOUNTER — Encounter: Payer: Self-pay | Admitting: Student

## 2022-10-28 VITALS — BP 89/72 | HR 88 | Wt 159.0 lb

## 2022-10-28 DIAGNOSIS — F419 Anxiety disorder, unspecified: Secondary | ICD-10-CM

## 2022-10-28 DIAGNOSIS — Z8759 Personal history of other complications of pregnancy, childbirth and the puerperium: Secondary | ICD-10-CM

## 2022-10-28 DIAGNOSIS — Z3483 Encounter for supervision of other normal pregnancy, third trimester: Secondary | ICD-10-CM

## 2022-10-28 NOTE — Patient Instructions (Addendum)
It was great to see you today! Thank you for choosing [facility] for your obstetric care. Rebecca Schneider was seen for ob visit.  We will continue seeing you once weekly now!  Your birth plan is very important to [name], please give [name] details as they change  You can start kick counting as well, should feel 3-4 kicks in 1 hour or 10 in 2 hours. Only do this if you have become concerned that baby is not moving   Newborn Information  baby needs to sleep in bassinet after birth, with tight sheet that is fitted and no stuffed animals or other item in the area to prevent suffocation, try to avoid others kissing baby and inform them of shaken baby syndrome concerns , Fed is best!! they should never go more than 3 hours in the day or 4 hours at night without feeding , We will talk about sick care once baby is born , and hepatitis B vaccine is highly recommended after birth and will be offered to you   Call your OB Clinic or go to [facility] if: You begin to have strong, frequent contractions Your water breaks.  Sometimes it is a big gush of fluid, sometimes it is just a trickle that keeps getting your panties wet or running down your legs You have vaginal bleeding.  It is normal to have a small amount of spotting if your cervix was checked.  You don't feel your baby moving like normal.  If you don't, get you something to eat and drink and lay down and focus on feeling your baby move.  You should feel at least 10 movements in 2 hours.  If you don't, you should call the office or go to [facility].   Continuaremos vindote una vez por Dalene Seltzer! Su plan de parto es muy importante para nosotros, por favor proporcinenos los [place] a medida que [place]. Tambin puedes empezar a contar patadas; deberas sentir de 3 a 4 patadas en 1 hora o 10 en 2 horas. Haga esto solo si le preocupa que el beb no se mueva.  Informacin del recin nacido El beb necesita dormir en un moiss  despus del nacimiento, con una sbana [place] y sin animales de peluche u otros elementos en el rea para Transport planner asfixia, trate de evitar que otras personas besen al beb e infrmeles sobre las preocupaciones sobre el sndrome del beb sacudido. Alimentarlo es lo mejor!! nunca deben pasar ms de 3 horas en el da o 4 horas en la noche sin alimentarse. Hablaremos sobre el cuidado de los enfermos una vez que nazca el beb, y la vacuna contra la hepatitis B es muy recomendable despus del nacimiento y se la ofreceremos a usted.  Llame a su clnica de obstetricia o vaya al [facility] si: ? Comienzas a tener contracciones fuertes y frecuentes. ? Se te rompe fuente. A veces es un gran chorro de lquido, a veces es solo un chorrito que sigue mojando tus bragas o corriendo por tus piernas. ? Tienes sangrado vaginal. Es normal tener un poco de sangrado si le revisaron el cuello uterino. ? No siente que su beb se mueve normalmente. Si no lo hace, consgale algo de comer y beber, recustese y concntrese en sentir cmo se mueve su beb. Deberas sentir al menos 10 movimientos en 2 horas. Si no lo hace, debe llamar al consultorio o ir al [facility].  If you haven't already, sign up for My Chart  to have easy access to your labs results, and communication with your primary care physician.  You should return to our clinic Return in about 1 week (around 11/04/2022).  I recommend that you always bring your medications to each appointment as this makes it easy to ensure you are on the correct medications and helps [name] not miss refills when you need them.  Please arrive 15 minutes before your appointment to ensure smooth check in process.  We appreciate your efforts in making this happen.  Please call the clinic at [phone] if your symptoms worsen or you have any concerns.  Thank you for allowing me to participate in your care, Erskine Emery, MD 10/28/2022, 10:01 AM PGY-2, [place]

## 2022-10-28 NOTE — Progress Notes (Signed)
  [facility] Prenatal Visit  Rebecca Schneider is a 36 y.o. G3P2002 at [redacted]w[redacted]d here for routine follow up. She is dated by LMP.  She reports  of crampy abdominal pain intermittently but with no real regular contractions . She reports fetal movement. She denies vaginal bleeding, contractions, or loss of fluid. See flow sheet for details.  She does report scant vaginal discharge that seems normal for her.  She does also have some back pain that radiates to her bottom intermittently.  Vitals:   10/28/22 0927  BP: (!) 89/72  Pulse: 88    A/P: Pregnancy at [redacted]w[redacted]d.  Doing well.   Routine prenatal care:  Dating reviewed, dating tab is correct Fetal heart tones Appropriate--140 Fundal height within expected range.  Fetal position confirmed Vertex using Ultrasound  on last visit  Infant feeding choice: Breastfeeding Contraception choice: Pills  Infant circumcision desired no Pain control in labor discussed and patient desires natural birth.  Influenza vaccine: previously given [facility] letter; patient will make an appt  Tdap previously given [facility] letter; patient will make an appt  COVID vaccination was discussed and declined.  GBS and gc/chlamydia testing results were reviewed today.   Pregnancy education regarding labor, fetal movement,  benefits of breastfeeding, contraception, and safe infant sleep were discussed.  Labor and fetal movement precautions reviewed. Induction of labor discussed. BPP scheduled between 40-41 weeks. Will schedule induction at next visit    2. Pregnancy issues include the following and were addressed as appropriate today:   Patient can continue with Tylenol for her back pain.  Monitor for contractions discussed that she has a risk of precipitous delivery. Scheduled BPP today I discussed with the patient, further information on that visit in AVS. Will schedule induction at next visit if needed. Cervical check performed today: Patient is closed,  thick, high.  This was the discussed with the patient.  Problem list and pregnancy box updated: yes.   Follow up 1 week.

## 2022-10-31 ENCOUNTER — Other Ambulatory Visit: Payer: Self-pay

## 2022-10-31 ENCOUNTER — Encounter (HOSPITAL_COMMUNITY): Payer: Self-pay | Admitting: Obstetrics and Gynecology

## 2022-10-31 ENCOUNTER — Inpatient Hospital Stay (HOSPITAL_COMMUNITY)
Admission: AD | Admit: 2022-10-31 | Discharge: 2022-11-02 | DRG: 807 | Disposition: A | Discharge status: Home / Self Care | Payer: Medicaid Other | Attending: Obstetrics and Gynecology | Admitting: Obstetrics and Gynecology

## 2022-10-31 DIAGNOSIS — Z3490 Encounter for supervision of normal pregnancy, unspecified, unspecified trimester: Secondary | ICD-10-CM

## 2022-10-31 DIAGNOSIS — O4292 Full-term premature rupture of membranes, unspecified as to length of time between rupture and onset of labor: Principal | ICD-10-CM | POA: Diagnosis present

## 2022-10-31 DIAGNOSIS — Z8759 Personal history of other complications of pregnancy, childbirth and the puerperium: Secondary | ICD-10-CM

## 2022-10-31 DIAGNOSIS — Z3A39 39 weeks gestation of pregnancy: Secondary | ICD-10-CM

## 2022-10-31 LAB — POCT FERN TEST: POCT Fern Test: POSITIVE

## 2022-10-31 NOTE — MAU Note (Addendum)
.  Rebecca Schneider is a 36 y.o. at [redacted]w[redacted]d here in MAU reporting: water broke at 2220 - has continued to leak. Reports some spotting, irregular ctx. +FM   Onset of complaint: 2220 Pain score: 2 Vitals:   10/31/22 2334  BP: 111/67  Pulse: 86  Resp: 17  Temp: 98.2 F (36.8 C)  SpO2: 100%     FHT:142 Lab orders placed from triage:  fern

## 2022-11-01 ENCOUNTER — Encounter (HOSPITAL_COMMUNITY): Payer: Self-pay | Admitting: Family Medicine

## 2022-11-01 ENCOUNTER — Inpatient Hospital Stay (HOSPITAL_COMMUNITY): Payer: Medicaid Other | Admitting: Anesthesiology

## 2022-11-01 DIAGNOSIS — O26893 Other specified pregnancy related conditions, third trimester: Secondary | ICD-10-CM | POA: Diagnosis present

## 2022-11-01 DIAGNOSIS — Z3A39 39 weeks gestation of pregnancy: Secondary | ICD-10-CM | POA: Diagnosis not present

## 2022-11-01 DIAGNOSIS — O4292 Full-term premature rupture of membranes, unspecified as to length of time between rupture and onset of labor: Secondary | ICD-10-CM | POA: Diagnosis present

## 2022-11-01 LAB — CBC
HCT: 34.7 % — ABNORMAL LOW (ref 36.0–46.0)
Hemoglobin: 11.4 g/dL — ABNORMAL LOW (ref 12.0–15.0)
MCH: 29.4 pg (ref 26.0–34.0)
MCHC: 32.9 g/dL (ref 30.0–36.0)
MCV: 89.4 fL (ref 80.0–100.0)
Platelets: 149 10*3/uL — ABNORMAL LOW (ref 150–400)
RBC: 3.88 MIL/uL (ref 3.87–5.11)
RDW: 14.9 % (ref 11.5–15.5)
WBC: 7.7 10*3/uL (ref 4.0–10.5)
nRBC: 0 % (ref 0.0–0.2)

## 2022-11-01 LAB — TYPE AND SCREEN
ABO/RH(D): A POS
Antibody Screen: NEGATIVE

## 2022-11-01 LAB — RPR: RPR Ser Ql: NONREACTIVE

## 2022-11-01 MED ORDER — PHENYLEPHRINE 80 MCG/ML (10ML) SYRINGE FOR IV PUSH (FOR BLOOD PRESSURE SUPPORT): 80.0000 ug | PREFILLED_SYRINGE | INTRAVENOUS | Status: DC | PRN

## 2022-11-01 MED ORDER — DIPHENHYDRAMINE HCL 50 MG/ML IJ SOLN: 12.5000 mg | INTRAMUSCULAR | Status: DC | PRN

## 2022-11-01 MED ORDER — ONDANSETRON HCL 4 MG PO TABS: 4.0000 mg | ORAL_TABLET | ORAL | Status: DC | PRN

## 2022-11-01 MED ORDER — WITCH HAZEL-GLYCERIN EX PADS: 1.0000 | MEDICATED_PAD | CUTANEOUS | Status: DC | PRN

## 2022-11-01 MED ORDER — SENNOSIDES-DOCUSATE SODIUM 8.6-50 MG PO TABS
2.0000 | ORAL_TABLET | ORAL | Status: DC
  Administered 2022-11-01 – 2022-11-02 (×2): 2 via ORAL
  Filled 2022-11-01 (×2): qty 2

## 2022-11-01 MED ORDER — LIDOCAINE HCL (PF) 1 % IJ SOLN: 30.0000 mL | INTRAMUSCULAR | Status: DC | PRN

## 2022-11-01 MED ORDER — TETANUS-DIPHTH-ACELL PERTUSSIS 5-2.5-18.5 LF-MCG/0.5 IM SUSY: 0.5000 mL | PREFILLED_SYRINGE | Freq: Once | INTRAMUSCULAR | Status: DC

## 2022-11-01 MED ORDER — SODIUM CHLORIDE 0.9% FLUSH
3.0000 mL | Freq: Two times a day (BID) | INTRAVENOUS | Status: DC
  Administered 2022-11-01: 3 mL via INTRAVENOUS

## 2022-11-01 MED ORDER — EPHEDRINE 5 MG/ML INJ: 10.0000 mg | INTRAVENOUS | Status: DC | PRN

## 2022-11-01 MED ORDER — PRENATAL MULTIVITAMIN CH
1.0000 | ORAL_TABLET | Freq: Every day | ORAL | Status: DC
  Administered 2022-11-01 – 2022-11-02 (×2): 1 via ORAL
  Filled 2022-11-01 (×2): qty 1

## 2022-11-01 MED ORDER — OXYTOCIN-SODIUM CHLORIDE 30-0.9 UT/500ML-% IV SOLN
1.0000 m[IU]/min | INTRAVENOUS | Status: DC
  Administered 2022-11-01: 2 m[IU]/min via INTRAVENOUS
  Filled 2022-11-01: qty 500

## 2022-11-01 MED ORDER — DIPHENHYDRAMINE HCL 25 MG PO CAPS: 25.0000 mg | ORAL_CAPSULE | Freq: Four times a day (QID) | ORAL | Status: DC | PRN

## 2022-11-01 MED ORDER — LACTATED RINGERS IV SOLN: INTRAVENOUS | Status: DC

## 2022-11-01 MED ORDER — TRANEXAMIC ACID-NACL 1000-0.7 MG/100ML-% IV SOLN
1000.0000 mg | INTRAVENOUS | Status: AC
  Administered 2022-11-01: 1000 mg via INTRAVENOUS

## 2022-11-01 MED ORDER — METHYLERGONOVINE MALEATE 0.2 MG PO TABS
0.2000 mg | ORAL_TABLET | ORAL | Status: AC
  Administered 2022-11-01 – 2022-11-02 (×6): 0.2 mg via ORAL
  Filled 2022-11-01 (×6): qty 1

## 2022-11-01 MED ORDER — FENTANYL CITRATE (PF) 100 MCG/2ML IJ SOLN: 50.0000 ug | Freq: Once | INTRAMUSCULAR | Status: DC

## 2022-11-01 MED ORDER — TERBUTALINE SULFATE 1 MG/ML IJ SOLN: 0.2500 mg | Freq: Once | INTRAMUSCULAR | Status: DC | PRN

## 2022-11-01 MED ORDER — METHYLERGONOVINE MALEATE 0.2 MG/ML IJ SOLN
0.2000 mg | Freq: Once | INTRAMUSCULAR | Status: AC
  Administered 2022-11-01: 0.2 mg via INTRAMUSCULAR
  Filled 2022-11-01: qty 1

## 2022-11-01 MED ORDER — FENTANYL-BUPIVACAINE-NACL 0.5-0.125-0.9 MG/250ML-% EP SOLN
12.0000 mL/h | EPIDURAL | Status: DC | PRN
  Administered 2022-11-01: 12 mL/h via EPIDURAL
  Filled 2022-11-01: qty 250

## 2022-11-01 MED ORDER — BENZOCAINE-MENTHOL 20-0.5 % EX AERO
1.0000 | INHALATION_SPRAY | CUTANEOUS | Status: DC | PRN
  Administered 2022-11-01: 1 via TOPICAL
  Filled 2022-11-01: qty 56

## 2022-11-01 MED ORDER — SOD CITRATE-CITRIC ACID 500-334 MG/5ML PO SOLN: 30.0000 mL | ORAL | Status: DC | PRN

## 2022-11-01 MED ORDER — IBUPROFEN 600 MG PO TABS
600.0000 mg | ORAL_TABLET | Freq: Four times a day (QID) | ORAL | Status: DC
  Administered 2022-11-01 – 2022-11-02 (×4): 600 mg via ORAL
  Filled 2022-11-01 (×4): qty 1

## 2022-11-01 MED ORDER — DIBUCAINE (PERIANAL) 1 % EX OINT: 1.0000 | TOPICAL_OINTMENT | CUTANEOUS | Status: DC | PRN

## 2022-11-01 MED ORDER — OXYTOCIN BOLUS FROM INFUSION
333.0000 mL | Freq: Once | INTRAVENOUS | Status: AC
  Administered 2022-11-01: 333 mL via INTRAVENOUS

## 2022-11-01 MED ORDER — FENTANYL CITRATE (PF) 100 MCG/2ML IJ SOLN
100.0000 ug | INTRAMUSCULAR | Status: DC | PRN
  Administered 2022-11-01: 100 ug via INTRAVENOUS
  Filled 2022-11-01: qty 2

## 2022-11-01 MED ORDER — SODIUM CHLORIDE 0.9 % IV SOLN: 250.0000 mL | INTRAVENOUS | Status: DC | PRN

## 2022-11-01 MED ORDER — LACTATED RINGERS IV SOLN: 500.0000 mL | INTRAVENOUS | Status: DC | PRN

## 2022-11-01 MED ORDER — COCONUT OIL OIL: 1.0000 | TOPICAL_OIL | Status: DC | PRN

## 2022-11-01 MED ORDER — TRANEXAMIC ACID-NACL 1000-0.7 MG/100ML-% IV SOLN
INTRAVENOUS | Status: AC
  Filled 2022-11-01: qty 100

## 2022-11-01 MED ORDER — LIDOCAINE HCL (PF) 1 % IJ SOLN
INTRAMUSCULAR | Status: DC | PRN
  Administered 2022-11-01: 3 mL via EPIDURAL
  Administered 2022-11-01: 2 mL via EPIDURAL
  Administered 2022-11-01: 5 mL via EPIDURAL

## 2022-11-01 MED ORDER — SIMETHICONE 80 MG PO CHEW: 80.0000 mg | CHEWABLE_TABLET | ORAL | Status: DC | PRN

## 2022-11-01 MED ORDER — SODIUM CHLORIDE 0.9% FLUSH: 3.0000 mL | INTRAVENOUS | Status: DC | PRN

## 2022-11-01 MED ORDER — ZOLPIDEM TARTRATE 5 MG PO TABS: 5.0000 mg | ORAL_TABLET | Freq: Every evening | ORAL | Status: DC | PRN

## 2022-11-01 MED ORDER — LACTATED RINGERS IV SOLN: 500.0000 mL | Freq: Once | INTRAVENOUS | Status: DC

## 2022-11-01 MED ORDER — OXYTOCIN-SODIUM CHLORIDE 30-0.9 UT/500ML-% IV SOLN
2.5000 [IU]/h | INTRAVENOUS | Status: DC
  Administered 2022-11-01: 2.5 [IU]/h via INTRAVENOUS

## 2022-11-01 MED ORDER — ONDANSETRON HCL 4 MG/2ML IJ SOLN: 4.0000 mg | INTRAMUSCULAR | Status: DC | PRN

## 2022-11-01 MED ORDER — ONDANSETRON HCL 4 MG/2ML IJ SOLN: 4.0000 mg | Freq: Four times a day (QID) | INTRAMUSCULAR | Status: DC | PRN

## 2022-11-01 MED ORDER — SODIUM CHLORIDE 0.9 % IV SOLN
3.0000 g | Freq: Once | INTRAVENOUS | Status: AC
  Administered 2022-11-01: 3 g via INTRAVENOUS
  Filled 2022-11-01: qty 8

## 2022-11-01 NOTE — Progress Notes (Signed)
CTBS for continued bleeding and passing small clots after previous provider manually removed retained POCs.   VSS. Uterus now up to 1/U from U/1, Mod gush of blood w/ Fundal massage. Uterus slightly boggy, firmed w/ massage. Bladder emptied w/ I&O cath, Uterus swept x 2. Mod clot and small fragments of membranes and placental tissue removed. Bleeding scant. Uterus U/1. QBL 579 ml.   Methergine IM given. Will give PO series Q4 hours x 24 hours and TXA.  Unasyn ordered for multiple uterine sweeps.  Considered Mel Almond, but this appears to be a retained POCs issue rather than an atony issue.  CTO for bleeding and infection closely. Attending made aware.  [place], CNM 11/01/2022 9:42 AM

## 2022-11-01 NOTE — Anesthesia Procedure Notes (Signed)
Epidural Patient location during procedure: OB Start time: 11/01/2022 6:40 AM End time: 11/01/2022 6:49 AM  Staffing Anesthesiologist: Marcene Duos, MD Performed: anesthesiologist   Preanesthetic Checklist Completed: patient identified, IV checked, site marked, risks and benefits discussed, surgical consent, monitors and equipment checked, pre-op evaluation and timeout performed  Epidural Patient position: sitting Prep: DuraPrep and site prepped and draped Patient monitoring: continuous pulse ox and blood pressure Approach: midline Location: L3-L4 Injection technique: LOR air  Needle:  Needle type: Tuohy  Needle gauge: 17 G Needle length: 9 cm and 9 Needle insertion depth: 6 cm Catheter type: closed end flexible Catheter size: 19 Gauge Catheter at skin depth: 11 cm Test dose: negative  Assessment Events: blood not aspirated, no cerebrospinal fluid, injection not painful, no injection resistance, no paresthesia and negative IV test

## 2022-11-01 NOTE — H&P (Signed)
OBSTETRIC ADMISSION HISTORY AND PHYSICAL  Rebecca Schneider is a 36 y.o. female G3P2002 with IUP at [redacted]w[redacted]d by LMP presenting for SOL. She reports +FMs, No LOF, no VB, no blurry vision, headaches or peripheral edema, and RUQ pain.  She plans on breast feeding. She request condoms for birth control. She received her prenatal care at [facility]  Dating: By LMP --->  Estimated Date of Delivery: 11/06/22  Sono:    @[redacted]w[redacted]d , CWD, normal anatomy, anterior placenta, 288g, 22.5% EFW   Prenatal History/Complications: None  Past Medical History: Past Medical History:  Diagnosis Date   Hypotension    began after delivery of baby 11/01/14    Past Surgical History: History reviewed. No pertinent surgical history.  Obstetrical History: OB History     Gravida  3   Para  2   Term  2   Preterm      AB      Living  2      SAB      IAB      Ectopic      Multiple  0   Live Births  2           Social History Social History   Socioeconomic History   Marital status: Married    Spouse name: Not on file   Number of children: Not on file   Years of education: Not on file   Highest education level: Not on file  Occupational History   Not on file  Tobacco Use   Smoking status: Never   Smokeless tobacco: Never  Vaping Use   Vaping Use: Never used  Substance and Sexual Activity   Alcohol use: No   Drug use: No   Sexual activity: Yes    Birth control/protection: Pill  Other Topics Concern   Not on file  Social History Narrative   Not on file   Social Determinants of Health   Financial Resource Strain: Not on file  Food Insecurity: No Food Insecurity (01/29/2022)   Hunger Vital Sign    Worried About Running Out of Food in the Last Year: Never true    Ran Out of Food in the Last Year: Never true  Transportation Needs: Unmet Transportation Needs (01/29/2022)   PRAPARE - 03/31/2022 (Medical): Yes    Lack of Transportation (Non-Medical): Yes   Physical Activity: Not on file  Stress: Not on file  Social Connections: Not on file    Family History: Family History  Problem Relation Age of Onset   Cancer Brother        neck   Diabetes Maternal Grandfather     Allergies: Allergies  Allergen Reactions   Tylenol [Acetaminophen]     Medications Prior to Admission  Medication Sig Dispense Refill Last Dose   Prenatal Vit-Fe Fumarate-FA (PRENATAL VITAMINS PO) Take 1 tablet by mouth daily.   10/30/2022   docusate sodium (COLACE) 50 MG capsule Take 1 capsule (50 mg total) by mouth 2 (two) times daily. 10 capsule 0    famotidine (PEPCID) 20 MG tablet Take 1 tablet (20 mg total) by mouth 2 (two) times daily. 60 tablet 1    ondansetron (ZOFRAN) 4 MG tablet Take 1 tablet (4 mg total) by mouth every 8 (eight) hours as needed for nausea or vomiting. 20 tablet 0      Review of Systems   All systems reviewed and negative except as stated in HPI  Blood pressure 111/67, pulse 86,  temperature 98.2 F (36.8 C), temperature source Oral, resp. rate 17, height 5\' 1"  (1.549 m), weight 72.2 kg, last menstrual period 01/30/2022, SpO2 100 %. General appearance: alert, cooperative, and appears stated age Lungs: Normal work of breathing Heart: regular rate \ Abdomen: soft, non-tender; Presentation: cephalic Fetal monitoringBaseline: 135 bpm, Variability: Good {> 6 bpm), Accelerations: Reactive, and Decelerations: Absent Uterine activity every 3 to 5 minutes Dilation: 3 Effacement (%): 70 Station: -2 Exam by:: [email or web address], RN   Prenatal labs: ABO, Rh: --/--/PENDING (12/02 2355) Antibody: PENDING (12/02 2355) Rubella: 16.40 (05/30 0934) RPR: Non Reactive (09/25 1515)  HBsAg: Negative (05/30 0934)  HIV: Non Reactive (09/25 1515)  GBS: Negative/-- (11/16 1548)  1 hr Glucola Normal  Genetic screening  Declined Anatomy 03-05-2000 normal  Prenatal Transfer Tool  Maternal Diabetes: No Genetic Screening: Declined Maternal  Ultrasounds/Referrals: Normal Fetal Ultrasounds or other Referrals:  None Maternal Substance Abuse:  No Significant Maternal Medications:  None Significant Maternal Lab Results:  Group B Strep negative Number of Prenatal Visits:greater than 3 verified prenatal visits Other Comments:  None  Results for orders placed or performed during the hospital encounter of 10/31/22 (from the past 24 hour(s))  Fern Test   Collection Time: 10/31/22 11:51 PM  Result Value Ref Range   POCT Fern Test Positive = ruptured amniotic membanes   CBC   Collection Time: 10/31/22 11:55 PM  Result Value Ref Range   WBC 7.7 4.0 - 10.5 K/uL   RBC 3.88 3.87 - 5.11 MIL/uL   Hemoglobin 11.4 (L) 12.0 - 15.0 g/dL   HCT 14/02/23 (L) 16.1 - 09.6 %   MCV 89.4 80.0 - 100.0 fL   MCH 29.4 26.0 - 34.0 pg   MCHC 32.9 30.0 - 36.0 g/dL   RDW 04.5 40.9 - 81.1 %   Platelets 149 (L) 150 - 400 K/uL   nRBC 0.0 0.0 - 0.2 %  Type and screen [facility]   Collection Time: 10/31/22 11:55 PM  Result Value Ref Range   ABO/RH(D) PENDING    Antibody Screen PENDING    Sample Expiration      11/03/2022,2359 Performed at [facility], [address] [place], [place]     Patient Active Problem List   Diagnosis Date Noted   Dizziness 07/30/2022   History of precipitous delivery 07/23/2022   Supervision of normal pregnancy 05/05/2022   Anxious mood 05/05/2022    Assessment/Plan:  Rebecca Schneider is a 36 y.o. G3P2002 at [redacted]w[redacted]d here for SOL  #Labor:Progressing spontaneously.  Patient would not like to augment at this time.  We will continue to monitor. #Pain: Per patient request  #FWB: Cat 1 #ID:  GBS neg #MOF: Breast #MOC:Condoms  #Circ:  No  [redacted]w[redacted]d, MD  11/01/2022, 12:11 AM

## 2022-11-01 NOTE — Discharge Summary (Shared)
Postpartum Discharge Summary  Date of Service updated***     Patient Name: Rebecca Schneider DOB: [birth year] MRN: [id number]  Date of admission: 10/31/2022 Delivery date:11/01/2022  Delivering provider: Shelda Pal  Date of discharge: 11/01/2022  Admitting diagnosis: Indication for care in labor or delivery [O75.9] Intrauterine pregnancy: [redacted]w[redacted]d    Secondary diagnosis:  Principal Problem:   Vaginal delivery Active Problems:   Supervision of normal pregnancy   History of precipitous delivery   Indication for care in labor or delivery  Additional problems: ***    Discharge diagnosis: Term Pregnancy Delivered                                              Post partum procedures:{Postpartum procedures:23558} Augmentation: Pitocin Complications: None  Hospital course: Onset of Labor With Vaginal Delivery      36y.o. yo G3P2002 at 347w2das admitted in Latent Labor on 10/31/2022. Labor course was complicated by  none. Membrane Rupture Time/Date: 10:20 PM ,10/31/2022   Delivery Method:Vaginal, Spontaneous  Episiotomy: None  Lacerations:  1st degree;Perineal  Patient had a postpartum course complicated by ***.  She is ambulating, tolerating a regular diet, passing flatus, and urinating well. Patient is discharged home in stable condition on 11/01/22.  Newborn Data: Birth date:11/01/2022  Birth time:7:58 AM  Gender:Female  Living status:Living  Apgars:9 ,9  Weight:   Magnesium Sulfate received: {Mag received:30440022} BMZ received: No Rhophylac:N/A MMR:N/A T-DaP:*** Flu: N/A Transfusion:{Transfusion received:30440034}  Physical exam  Vitals:   11/01/22 0831 11/01/22 0848 11/01/22 0901 11/01/22 0916  BP: 103/83 (!) 102/56 96/68 103/64  Pulse: (!) 175 81 85 (!) 189  Resp: _0 Temp:  97.8 F (36.6 C)    TempSrc:  Oral    SpO2:      Weight:      Height:       General: {Exam; general:21111117} Lochia: {Desc;  appropriate/inappropriate:30686::"appropriate"} Uterine Fundus: {Desc; firm/soft:30687} Incision: {Exam; incision:21111123} DVT Evaluation: {Exam; dvt:2111122} Labs: Lab Results  Component Value Date   WBC 7.7 10/31/2022   HGB 11.4 (L) 10/31/2022   HCT 34.7 (L) 10/31/2022   MCV 89.4 10/31/2022   PLT 149 (L) 10/31/2022      Latest Ref Rng & Units 07/31/2022   12:27 PM  CMP  Glucose 70 - 99 mg/dL 67   BUN 6 - 20 mg/dL 12   Creatinine 0.57 - 1.00 mg/dL 0.57   Sodium 134 - 144 mmol/L 136   Potassium 3.5 - 5.2 mmol/L 4.2   Chloride 96 - 106 mmol/L 103   CO2 20 - 29 mmol/L 22   Calcium 8.7 - 10.2 mg/dL 8.8   Total Protein 6.0 - 8.5 g/dL 5.7   Total Bilirubin 0.0 - 1.2 mg/dL <0.2   Alkaline Phos 44 - 121 IU/L 57   AST 0 - 40 IU/L 13   ALT 0 - 32 IU/L 10    Edinburgh Score:     No data to display           After visit meds:  Allergies as of 11/01/2022       Reactions   Tylenol [acetaminophen]      Med Rec must be completed prior to [facility]        Discharge home in stable condition Infant Feeding: {Baby feeding:23562} Infant Disposition:{CHL IP OB HOME WITH  TGGYIR:48546} Discharge instruction: per After Visit Summary and Postpartum booklet. Activity: Advance as tolerated. Pelvic rest for 6 weeks.  Diet: {OB EVOJ:50093818} Future Appointments: Future Appointments  Date Time Provider [place]  11/03/2022  9:30 AM Erskine Emery, MD [facility]  11/09/2022 11:15 AM [facility]   Follow up Visit: Message sent to [facility] 12/3  Please schedule this patient for a In person postpartum visit in 6 weeks with the following provider: Any provider. Additional Postpartum F/U: n/a   Low risk pregnancy complicated by:  n/a Delivery mode:  Vaginal, Spontaneous  Anticipated Birth Control:  Condoms   11/01/2022 Shelda Pal, DO

## 2022-11-01 NOTE — Anesthesia Preprocedure Evaluation (Signed)
Anesthesia Evaluation  Patient identified by MRN, date of birth, ID band Patient awake    Reviewed: Allergy & Precautions, Patient's Chart, lab work & pertinent test results  Airway Mallampati: II  TM Distance: >3 FB     Dental  (+) Dental Advisory Given   Pulmonary neg pulmonary ROS   breath sounds clear to auscultation       Cardiovascular negative cardio ROS  Rhythm:Regular Rate:Normal     Neuro/Psych negative neurological ROS     GI/Hepatic negative GI ROS, Neg liver ROS,,,  Endo/Other  negative endocrine ROS    Renal/GU negative Renal ROS     Musculoskeletal   Abdominal   Peds  Hematology negative hematology ROS (+)   Anesthesia Other Findings   Reproductive/Obstetrics (+) Pregnancy                             Anesthesia Physical Anesthesia Plan  ASA: 2  Anesthesia Plan: Epidural   Post-op Pain Management:    Induction:   PONV Risk Score and Plan: 2 and Treatment may vary due to age or medical condition  Airway Management Planned: Natural Airway  Additional Equipment:   Intra-op Plan:   Post-operative Plan:   Informed Consent: I have reviewed the patients History and Physical, chart, labs and discussed the procedure including the risks, benefits and alternatives for the proposed anesthesia with the patient or authorized representative who has indicated his/her understanding and acceptance.       Plan Discussed with:   Anesthesia Plan Comments:        Anesthesia Quick Evaluation

## 2022-11-01 NOTE — Lactation Note (Signed)
This note was copied from a baby's chart. Lactation Consultation Note  Patient Name: Rebecca Schneider [id number] Date: 11/01/2022 Reason for consult: L&D Initial assessment Age:36 hours  LC contacted Candida Peeling RN. Mother is experienced and took a breastfeeding class. Currently declines LC services.   Feeding Mother's Current Feeding Choice: Breast Milk  Consult Status Consult Status: Complete (Mother declines LC services, per Candida Peeling RN)    Myli Pae A Higuera Ancidey 11/01/2022, 9:22 AM

## 2022-11-02 LAB — CBC
HCT: 28.8 % — ABNORMAL LOW (ref 36.0–46.0)
Hemoglobin: 9.9 g/dL — ABNORMAL LOW (ref 12.0–15.0)
MCH: 30.3 pg (ref 26.0–34.0)
MCHC: 34.4 g/dL (ref 30.0–36.0)
MCV: 88.1 fL (ref 80.0–100.0)
Platelets: 140 10*3/uL — ABNORMAL LOW (ref 150–400)
RBC: 3.27 MIL/uL — ABNORMAL LOW (ref 3.87–5.11)
RDW: 15.1 % (ref 11.5–15.5)
WBC: 9.8 10*3/uL (ref 4.0–10.5)
nRBC: 0 % (ref 0.0–0.2)

## 2022-11-02 MED ORDER — BENZOCAINE-MENTHOL 20-0.5 % EX AERO: 1.0000 | INHALATION_SPRAY | CUTANEOUS | 0 refills | Status: DC | PRN

## 2022-11-02 MED ORDER — COCONUT OIL OIL: 1.0000 | TOPICAL_OIL | 0 refills | Status: DC | PRN

## 2022-11-02 MED ORDER — IBUPROFEN 600 MG PO TABS: 600.0000 mg | ORAL_TABLET | Freq: Four times a day (QID) | ORAL | 0 refills | Status: DC

## 2022-11-02 MED ORDER — SENNOSIDES-DOCUSATE SODIUM 8.6-50 MG PO TABS: 2.0000 | ORAL_TABLET | ORAL | 0 refills | Status: AC

## 2022-11-02 MED ORDER — IRON (FERROUS SULFATE) 325 (65 FE) MG PO TABS: 1.0000 | ORAL_TABLET | ORAL | 0 refills | Status: DC

## 2022-11-02 NOTE — Progress Notes (Signed)
CSW received consult for hx of Anxiety.  CSW met with MOB to offer support and complete assessment, FOB present. CSW utilized [employer] language [employer] spanish video interpreter (Stephanie #[id number]). CSW introduced self. MOB granted CSW verbal permission to speak in front of FOB about anything. CSW explained reason for consult. MOB was polite and remained engaged during assessment. CSW and MOB discussed MOB's mental health history. MOB reported that she was diagnosed with anxiety 3-4 years ago. MOB denied any current symptoms, noting she has not had any symptoms this month. MOB disclosed that she was taking medication but discontinued use because the symptoms subsided. MOB shared that she learned to manage anxiety without medication. CSW inquired about MOB's coping skills. MOB explained that it is all about "mentality" and shared that she had to overcome fear and be aware that she was in control. CSW positively affirmed MOB's mentality that is helping her manage anxiety. MOB denied any additional mental health history. MOB denied any history of postpartum depression. CSW asked if MOB needed any mental health treatment resources. MOB reported that her doctor has provided her with a lot of information and medication just in case she needs it. CSW inquired about how MOB was feeling emotionally since giving birth, MOB reported that she was feeling good and shared that she hadn't experienced any "lows in her mood". MOB presented calm and did not demonstrate any acute mental health signs/symptoms. CSW assessed for safety, MOB denied SI and HI. CSW did not assess for domestic violence as MOB was not alone. CSW inquired about MOB's support system, MOB reported that FOB is a support.   CSW provided education regarding the baby blues period vs. perinatal mood disorders, discussed treatment and gave resources for mental health follow up if concerns arise.  CSW recommends self-evaluation during the postpartum time  period using the New Mom Checklist from Postpartum Progress and encouraged MOB to contact a medical professional if symptoms are noted at any time.    CSW provided review of Sudden Infant Death Syndrome (SIDS) precautions. MOB verbalized understanding and reported having all items needed to care for infant including a car seat and crib.    CSW asked about lack of transportation noted in MOB's chart. MOB denied any transportation needs.   CSW identifies no further need for intervention and no barriers to discharge at this time.  Brielyn Bosak, LCSW Clinical Social Worker [facility] Cell#: [phone]   

## 2022-11-02 NOTE — Anesthesia Postprocedure Evaluation (Signed)
Anesthesia Post Note  Patient: Rebecca Schneider  Procedure(s) Performed: AN AD HOC LABOR EPIDURAL     Patient location during evaluation: Mother Baby Anesthesia Type: Epidural Level of consciousness: awake, oriented and awake and alert Pain management: pain level controlled Vital Signs Assessment: post-procedure vital signs reviewed and stable Respiratory status: spontaneous breathing, respiratory function stable and nonlabored ventilation Cardiovascular status: stable Postop Assessment: no headache, adequate PO intake, able to ambulate, patient able to bend at knees and no apparent nausea or vomiting Anesthetic complications: no   No notable events documented.  Last Vitals:  Vitals:   11/02/22 0000 11/02/22 0626  BP: (!) 97/49 (!) 83/53  Pulse: 80 73  Resp: 18 18  Temp: 36.7 C (!) 36.4 C  SpO2: 100% 99%    Last Pain:  Vitals:   11/02/22 0626  TempSrc: Oral  PainSc: 0-No pain   Pain Goal:                   [name]

## 2022-11-03 ENCOUNTER — Encounter: Payer: Self-pay | Admitting: Student

## 2022-11-09 ENCOUNTER — Other Ambulatory Visit: Payer: Self-pay

## 2022-11-10 ENCOUNTER — Telehealth (HOSPITAL_COMMUNITY): Payer: Self-pay | Admitting: *Deleted

## 2022-11-10 NOTE — Telephone Encounter (Signed)
Mom reports feeling good. No concerns about herself at this time. EPDS=4 [facility] score=8) Mom reports baby is doing well. Feeding, peeing, and pooping without difficulty. Safe sleep reviewed. Mom reports no concerns about baby at present.  Duffy Rhody, RN 11-10-2022 at 2:22pm

## 2022-12-02 ENCOUNTER — Ambulatory Visit (INDEPENDENT_AMBULATORY_CARE_PROVIDER_SITE_OTHER): Payer: Medicaid Other | Admitting: Student

## 2022-12-02 ENCOUNTER — Encounter: Payer: Self-pay | Admitting: Student

## 2022-12-02 VITALS — BP 91/61 | HR 70 | Ht 61.0 in | Wt 145.4 lb

## 2022-12-02 DIAGNOSIS — Z3009 Encounter for other general counseling and advice on contraception: Secondary | ICD-10-CM

## 2022-12-02 MED ORDER — NORETHINDRONE 0.35 MG PO TABS: 1.0000 | ORAL_TABLET | Freq: Every day | ORAL | 5 refills | Status: DC

## 2022-12-02 NOTE — Patient Instructions (Addendum)
It was great to see you today! Thank you for choosing [facility] for your primary care. Rebecca Schneider was seen for post partum visit.  Today we addressed: -Continue with Micronor  Back-up contraception should be used or the individual should abstain from sex for at least two days if the norethindrone POP is taken more than three hours late or missed on any given day, or if the patient starts POPs more than five days from the onset of menses   Hoy hemos abordado: -Continuar con Micronor  Se debe usar un mtodo anticonceptivo de respaldo o la persona debe abstenerse de [employer] si el POP de noretindrona se toma con ms de tres horas de retraso o se olvida en un da determinado, o si la paciente comienza a tomar COP ms de [employer] despus del inicio de la menstruacin   If you haven't already, sign up for My Chart to have easy access to your labs results, and communication with your primary care physician.  I recommend that you always bring your medications to each appointment as this makes it easy to ensure you are on the correct medications and helps [name] not miss refills when you need them. Call the clinic at [phone] if your symptoms worsen or you have any concerns.  You should return to our clinic Return if symptoms worsen or fail to improve. Please arrive 15 minutes before your appointment to ensure smooth check in process.  We appreciate your efforts in making this happen.  Thank you for allowing me to participate in your care, Erskine Emery, MD 12/02/2022, 2:37 PM PGY-2, [place]

## 2022-12-02 NOTE — Progress Notes (Signed)
  [place] Postpartum Visit   Rebecca Schneider is a 37 y.o. [phone] presenting for a postpartum visit.  She has the following concerns today: None  She delivered via SVD [redacted]w[redacted]d with requirement for uterine sweep due to retained POC x3 and was given Unasyn and Methergine series  She reports her vaginal bleeding is improved. She is breastfeeding/formula feeding her infant. She feels she is bonding well. She is considering POPs for contraception.   Edinburgh Postnatal Depression Scale: 5 (10 or higher is positive)  Reviewed pregnancy and delivery course.   Vitals:   12/02/22 1415  BP: 91/61  Pulse: 70  SpO2: 100%   Exam: Chaperoned by Lavell Anchors CMA, sutures healing well without exposed string, normal vaginal exam Card: RRR with no m/g/r Resp: CTAB Skin: warm and dry   A/P:  Postpartum visit: patient is 4 weeks postpartum following a SVD delivery. -Discussed patients delivery and complications -Patient had a 1st degree laceration, perineal healing reviewed. Patient expressed understanding -Patient has urinary incontinence? No -Patient is not ready for sexually activity, needs sutures to completely resolve  -Patient does not know if she wants a pregnancy in the next year.  Desired family size is 2-3 children.  -Reviewed forms of contraception in tiered fashion. Patient desired oral progesterone-only contraceptive today.   -Return to sexual activity and contraception discussed as above.  -Discussed birth spacing of 18 months -Breastfeeding: Yes, provided support of decision and resources as indicated  -Mood: Normal  -Discussed sleep and fatigue management and encouraged family/community support.  -Postpartum vaccines: None  -Need for postpartum diabetes screening:  N/A  -Reviewed prior Pap, up to date  -Continue oral iron  -Return in Return if symptoms worsen or fail to improve.

## 2023-08-02 ENCOUNTER — Encounter (HOSPITAL_COMMUNITY): Payer: Self-pay | Admitting: *Deleted

## 2023-08-02 ENCOUNTER — Ambulatory Visit (HOSPITAL_COMMUNITY)
Admission: EM | Admit: 2023-08-02 | Discharge: 2023-08-02 | Disposition: A | Discharge status: Home / Self Care | Payer: Self-pay | Attending: Family Medicine | Admitting: Family Medicine

## 2023-08-02 ENCOUNTER — Other Ambulatory Visit: Payer: Self-pay

## 2023-08-02 DIAGNOSIS — H9203 Otalgia, bilateral: Secondary | ICD-10-CM

## 2023-08-02 DIAGNOSIS — M5441 Lumbago with sciatica, right side: Secondary | ICD-10-CM

## 2023-08-02 DIAGNOSIS — G8929 Other chronic pain: Secondary | ICD-10-CM

## 2023-08-02 MED ORDER — MECLIZINE HCL 25 MG PO TABS: 25.0000 mg | ORAL_TABLET | Freq: Three times a day (TID) | ORAL | 0 refills | Status: DC | PRN

## 2023-08-02 MED ORDER — TIZANIDINE HCL 4 MG PO TABS: 4.0000 mg | ORAL_TABLET | Freq: Three times a day (TID) | ORAL | 0 refills | Status: DC | PRN

## 2023-08-02 MED ORDER — NAPROXEN 500 MG PO TABS: 500.0000 mg | ORAL_TABLET | Freq: Two times a day (BID) | ORAL | 0 refills | Status: DC | PRN

## 2023-08-02 NOTE — ED Provider Notes (Signed)
[facility]    CSN: [id number] Arrival date & time: 08/02/23  1058      History   Chief Complaint No chief complaint on file.   HPI Rebecca Schneider is a 37 y.o. female.   HPI Here for bilateral ear pain it has been going on for close to 1 year.  She states that during her recent pregnancy she began having bilateral ear pain.  Lab work was done in the diagnosis was "inflammation of the ears".  She is continue to have bilateral ear pain and will have tinnitus and sometimes some vertigo.   She also has some pain in her right low back that radiates down her right leg.  No bowel or bladder incontinence except for a little stress urinary incontinence with laughing .  Allergy list includes Tylenol and ibuprofen, which apparently caused more headaches.  Last menstrual cycle was August 19, and she is taking OCPs.  She states she has an appointment for a new primary care in early October.  Past Medical History:  Diagnosis Date   Hypotension    began after delivery of baby 11/01/14    Patient Active Problem List   Diagnosis Date Noted   Indication for care in labor or delivery 11/01/2022   Retained products of conception after delivery without hemorrhage 11/01/2022   Dizziness 07/30/2022   History of precipitous delivery 07/23/2022   Supervision of normal pregnancy 05/05/2022   Anxious mood 05/05/2022   Vaginal delivery 11/01/2014    History reviewed. No pertinent surgical history.  OB History     Gravida  3   Para  3   Term  3   Preterm      AB      Living  3      SAB      IAB      Ectopic      Multiple  0   Live Births  3            Home Medications    Prior to Admission medications   Medication Sig Start Date End Date Taking? Authorizing Provider  meclizine (ANTIVERT) 25 MG tablet Take 1 tablet (25 mg total) by mouth 3 (three) times daily as needed for dizziness (vertigo). 08/02/23  Yes Zenia Resides, MD  naproxen  (NAPROSYN) 500 MG tablet Take 1 tablet (500 mg total) by mouth 2 (two) times daily as needed (pain). 08/02/23  Yes Tyrelle Raczka, Janace Aris, MD  tiZANidine (ZANAFLEX) 4 MG tablet Take 1 tablet (4 mg total) by mouth every 8 (eight) hours as needed for muscle spasms. 08/02/23  Yes Zenia Resides, MD    Family History Family History  Problem Relation Age of Onset   Cancer Brother        neck   Diabetes Maternal Grandfather     Social History Social History   Tobacco Use   Smoking status: Never   Smokeless tobacco: Never  Vaping Use   Vaping status: Never Used  Substance Use Topics   Alcohol use: No   Drug use: No     Allergies   Ibuprofen and Tylenol [acetaminophen]   Review of Systems Review of Systems   Physical Exam Triage Vital Signs ED Triage Vitals [08/02/23 1137]  Encounter Vitals Group     BP      Systolic BP Percentile      Diastolic BP Percentile      Pulse      Resp  Temp      Temp src      SpO2      Weight      Height      Head Circumference      Peak Flow      Pain Score 7     Pain Loc      Pain Education      Exclude from Growth Chart    No data found.  Updated Vital Signs BP 133/72   Pulse 89   Temp 98.4 F (36.9 C)   Resp 18   LMP 07/19/2023   SpO2 94%   Breastfeeding No   Visual Acuity Right Eye Distance:   Left Eye Distance:   Bilateral Distance:    Right Eye Near:   Left Eye Near:    Bilateral Near:     Physical Exam Vitals reviewed.  Constitutional:      General: She is not in acute distress.    Appearance: She is not ill-appearing, toxic-appearing or diaphoretic.  HENT:     Right Ear: Tympanic membrane and ear canal normal.     Left Ear: Tympanic membrane and ear canal normal.     Nose: Nose normal. No congestion.     Mouth/Throat:     Mouth: Mucous membranes are moist.     Pharynx: No oropharyngeal exudate or posterior oropharyngeal erythema.  Eyes:     Extraocular Movements: Extraocular movements intact.      Conjunctiva/sclera: Conjunctivae normal.     Pupils: Pupils are equal, round, and reactive to light.  Cardiovascular:     Rate and Rhythm: Normal rate and regular rhythm.     Heart sounds: No murmur heard. Pulmonary:     Effort: Pulmonary effort is normal. No respiratory distress.     Breath sounds: No stridor. No wheezing, rhonchi or rales.  Musculoskeletal:     Cervical back: Neck supple.  Lymphadenopathy:     Cervical: No cervical adenopathy.  Skin:    Capillary Refill: Capillary refill takes less than 2 seconds.     Coloration: Skin is not jaundiced or pale.  Neurological:     General: No focal deficit present.     Mental Status: She is alert and oriented to person, place, and time.     Cranial Nerves: No cranial nerve deficit.  Psychiatric:        Behavior: Behavior normal.      UC Treatments / Results  Labs (all labs ordered are listed, but only abnormal results are displayed) Labs Reviewed - No data to display  EKG   Radiology No results found.  Procedures Procedures (including critical care time)  Medications Ordered in UC Medications - No data to display  Initial Impression / Assessment and Plan / UC Course  I have reviewed the triage vital signs and the nursing notes.  Pertinent labs & imaging results that were available during my care of the patient were reviewed by me and considered in my medical decision making (see chart for details).        Visit is conducted in [place].  We reviewed her allergies several times and at first she told me she did not have trouble taking ibuprofen, and then later said she did.  It was also unclear about Tylenol, as she said she had been taking it and would give her relief for some hours at least.  Naproxen is sent in for pain relief, and a muscle relaxer is sent in for bedtime.  She is  no longer nursing her infant.  Also meclizine is sent in for vertigo.  I discussed with her that she may have something called  Mnire's disease she is given information in Spanish concerning that syndrome. I did mention that usually Mnire's does not have any ear pain associated with it.  Her ear exam is completely normal at this time.    Final Clinical Impressions(s) / UC Diagnoses   Final diagnoses:  Chronic ear pain, bilateral  Chronic right-sided low back pain with right-sided sciatica     Discharge Instructions      Take naproxen 500 mg--1 tablet every 12 hours as needed for pain (Tome 1 tableta por la boca cada 12 horas si tiene dolor)  Take meclizine 25 mg--1 tablet every 8 hours as needed for vertigo (Tome 1 capsula por la voca 2 veces al dia por 5 dias.)   Take tizanidine 4 mg--1 every 8 hours as needed for muscle spasms; this medication can cause dizziness and sleepiness (Tome 1 tableta por la boca cada 8 horas para dolor/espasmo de musculo. La medicina puede causar sueno)       ED Prescriptions     Medication Sig Dispense Auth. Provider   naproxen (NAPROSYN) 500 MG tablet Take 1 tablet (500 mg total) by mouth 2 (two) times daily as needed (pain). 30 tablet Samy Ryner, Janace Aris, MD   tiZANidine (ZANAFLEX) 4 MG tablet Take 1 tablet (4 mg total) by mouth every 8 (eight) hours as needed for muscle spasms. 15 tablet Ashlley Booher, Janace Aris, MD   meclizine (ANTIVERT) 25 MG tablet Take 1 tablet (25 mg total) by mouth 3 (three) times daily as needed for dizziness (vertigo). 30 tablet Azlin Zilberman, Janace Aris, MD      I have reviewed the PDMP during this encounter.   Zenia Resides, MD 08/02/23 [phone]

## 2023-08-02 NOTE — Discharge Instructions (Signed)
Take naproxen 500 mg--1 tablet every 12 hours as needed for pain (Tome 1 tableta por la boca cada 12 horas si tiene dolor)  Take meclizine 25 mg--1 tablet every 8 hours as needed for vertigo (Tome 1 capsula por la voca 2 veces al dia por 5 dias.)   Take tizanidine 4 mg--1 every 8 hours as needed for muscle spasms; this medication can cause dizziness and sleepiness (Tome 1 tableta por la boca cada 8 horas para dolor/espasmo de musculo. La medicina puede causar sueno)

## 2023-08-02 NOTE — ED Triage Notes (Signed)
Pt reports bil. Ear pain for 9 months. Pt had ear pain thru out pregnancy. Pt will sometimes have HA. Pt has had blood work for the ear pain. Pt has been told she has ear inflammation.  Pt also reports cramping in RT glut that goes down the back of her leg to foot.

## 2023-09-28 ENCOUNTER — Encounter: Payer: Self-pay | Admitting: Family Medicine

## 2023-09-28 ENCOUNTER — Ambulatory Visit: Payer: Self-pay | Attending: Family Medicine | Admitting: Family Medicine

## 2023-09-28 ENCOUNTER — Other Ambulatory Visit: Payer: Self-pay

## 2023-09-28 VITALS — BP 107/69 | HR 72 | Ht 61.0 in | Wt 132.2 lb

## 2023-09-28 DIAGNOSIS — Z13228 Encounter for screening for other metabolic disorders: Secondary | ICD-10-CM

## 2023-09-28 DIAGNOSIS — H9201 Otalgia, right ear: Secondary | ICD-10-CM

## 2023-09-28 DIAGNOSIS — G43809 Other migraine, not intractable, without status migrainosus: Secondary | ICD-10-CM

## 2023-09-28 DIAGNOSIS — Z30011 Encounter for initial prescription of contraceptive pills: Secondary | ICD-10-CM

## 2023-09-28 DIAGNOSIS — R42 Dizziness and giddiness: Secondary | ICD-10-CM

## 2023-09-28 DIAGNOSIS — Z9189 Other specified personal risk factors, not elsewhere classified: Secondary | ICD-10-CM

## 2023-09-28 MED ORDER — NORETHINDRONE 0.35 MG PO TABS
1.0000 | ORAL_TABLET | Freq: Every day | ORAL | 6 refills | Status: DC
  Filled 2023-09-28: qty 84, 84d supply, fill #0

## 2023-09-28 MED ORDER — TOPIRAMATE 25 MG PO TABS: ORAL_TABLET | ORAL | 3 refills | Status: DC

## 2023-09-28 MED ORDER — MECLIZINE HCL 25 MG PO TABS: 25.0000 mg | ORAL_TABLET | Freq: Three times a day (TID) | ORAL | 0 refills | Status: DC | PRN

## 2023-09-28 NOTE — Patient Instructions (Signed)
Vrtigo Vertigo El vrtigo es la sensacin de que usted o las cosas que lo rodean se mueven o giran cuando, en realidad, eso no sucede. Es diferente a Equities trader. Tambin puede causarle lo siguiente: Prdida del equilibrio. Dificultad para mantenerse de pie o caminar. Nuseas y vmitos. Esta sensacin puede aparecer y desaparecer en cualquier momento. Puede durar de unos pocos segundos a minutos o incluso horas. Puede desaparecer sola o puede tratarse con medicamentos. Qu tipos de vrtigo se presentan? Hay dos tipos de vrtigo: El vrtigo perifrico ocurre cuando partes del odo interno no funcionan como deberan. Este es el tipo ms frecuente. El vrtigo central ocurre cuando el cerebro y la mdula espinal no funcionan como deberan. El [employer] har estudios para averiguar qu tipo de vrtigo tiene. Esto lo ayudar a decidir cul es el tratamiento adecuado para usted. Siga estas instrucciones en su casa: Comida y bebida Beba suficiente lquido para mantener el pis (orina) de color amarillo plido. No beba alcohol. Actividad Cuando se levante por la maana, sintese primero en un lado de la cama. Cuando se sienta bien, pngase lentamente de pie mientras se sostiene de algo. Muvase lentamente. No haga movimientos bruscos con el cuerpo o con la cabeza. Evite algunas posiciones, como se lo haya indicado el mdico. Use un bastn si tiene dificultad para ponerse de pie o caminar. Sintese de inmediato si se siente inestable. En su casa, coloque los objetos de modo que le resulte fcil alcanzarlos sin doblarse o agacharse. Retome sus actividades habituales cuando se lo indiquen. Pregunte qu cosas son seguras para que haga. Instrucciones generales Use los medicamentos solamente como se lo haya indicado el mdico. Comunquese con un mdico si: Los medicamentos que le indicaron no lo ayudan o empeoran el vrtigo. Tiene sntomas nuevos. Tiene fiebre. Tiene nuseas o vmitos. Sus  familiares o amigos notan algn cambio en su manera de actuar. Se le adormece una parte del cuerpo. Siente hormigueo y pinchazos en alguna parte del cuerpo. Tiene dolores de [place] muy intensos. Solicite ayuda de inmediato si: Se siente mareado continuamente o se desmaya. Tiene rigidez en el cuello. Tiene dificultad para moverse o hablar. Siente debilidad [employer], los brazos o las piernas. Tiene cambios en la audicin o la vista. Estos sntomas pueden Customer service manager. Llame al 911 de inmediato. No espere a ver si los sntomas desaparecen. No conduzca por sus propios medios [employer]. Esta informacin no tiene Theme park manager el consejo del mdico. Asegrese de hacerle al mdico cualquier pregunta que tenga. Document Revised: 03/25/2023 Document Reviewed: 03/25/2023 Elsevier Patient Education  2024 [employer].

## 2023-09-28 NOTE — Progress Notes (Signed)
Subjective:  Patient ID: Rebecca Schneider, female    DOB: 1986/04/08  Age: 37 y.o. MRN: [id number]  CC: New Patient (Initial Visit) (Headaches/Dizzy/Referral to dentist)   HPI Rebecca Schneider is a 37 y.o. year old female here to establish care.  Interval History: Discussed the use of AI scribe software for clinical note transcription with the patient, who gave verbal consent to proceed.   She presents with headaches, ear pain, and dizziness. She reports experiencing daily headaches, described as moderate in intensity, with occasional severe episodes that necessitate sleep. The headaches are associated with sharp, sudden ear pain, which the patient believes triggers the headaches.  The patient also reports episodes of dizziness, describing it as a sensation of the room spinning. These episodes of dizziness have been so severe that she has had to hold onto objects for support. The patient notes that these symptoms of headache, ear pain, and dizziness often occur concurrently. She also reports sensitivity to light and associated nausea during these episodes. The patient denies any sinus symptoms such as facial pressure or cheekbone pain. She also reports a need for a dental check-up and potential dental work.        Past Medical History:  Diagnosis Date   Hypotension    began after delivery of baby 11/01/14    No past surgical history on file.  Family History  Problem Relation Age of Onset   Cancer Brother        neck   Diabetes Maternal Grandfather     Social History   Socioeconomic History   Marital status: Married    Spouse name: Not on file   Number of children: Not on file   Years of education: Not on file   Highest education level: Not on file  Occupational History   Not on file  Tobacco Use   Smoking status: Never   Smokeless tobacco: Never  Vaping Use   Vaping status: Never Used  Substance and Sexual Activity   Alcohol use: No   Drug use: No    Sexual activity: Yes    Birth control/protection: Pill  Other Topics Concern   Not on file  Social History Narrative   Not on file   Social Determinants of Health   Financial Resource Strain: Not on file  Food Insecurity: No Food Insecurity (11/01/2022)   Hunger Vital Sign    Worried About Running Out of Food in the Last Year: Never true    Ran Out of Food in the Last Year: Never true  Transportation Needs: No Transportation Needs (11/01/2022)   PRAPARE - Administrator, Civil Service (Medical): No    Lack of Transportation (Non-Medical): No  Physical Activity: Not on file  Stress: Not on file  Social Connections: Not on file    Allergies  Allergen Reactions   Ibuprofen Other (See Comments)    Possible headaches   Tylenol [Acetaminophen] Other (See Comments)    Headaches resulted    Outpatient Medications Prior to Visit  Medication Sig Dispense Refill   naproxen (NAPROSYN) 500 MG tablet Take 1 tablet (500 mg total) by mouth 2 (two) times daily as needed (pain). (Patient not taking: Reported on 09/28/2023) 30 tablet 0   tiZANidine (ZANAFLEX) 4 MG tablet Take 1 tablet (4 mg total) by mouth every 8 (eight) hours as needed for muscle spasms. (Patient not taking: Reported on 09/28/2023) 15 tablet 0   meclizine (ANTIVERT) 25 MG tablet Take 1 tablet (25 mg  total) by mouth 3 (three) times daily as needed for dizziness (vertigo). (Patient not taking: Reported on 09/28/2023) 30 tablet 0   No facility-administered medications prior to visit.     ROS Review of Systems  Constitutional:  Negative for activity change and appetite change.  HENT:  Negative for sinus pressure and sore throat.   Respiratory:  Negative for chest tightness, shortness of breath and wheezing.   Cardiovascular:  Negative for chest pain and palpitations.  Gastrointestinal:  Negative for abdominal distention, abdominal pain and constipation.  Genitourinary: Negative.   Musculoskeletal: Negative.    Neurological:  Positive for dizziness and headaches.  Psychiatric/Behavioral:  Negative for behavioral problems and dysphoric mood.     Objective:  BP 107/69   Pulse 72   Ht 5\' 1"  (1.549 m)   Wt 132 lb 3.2 oz (60 kg)   SpO2 98%   BMI 24.98 kg/m      09/28/2023    8:55 AM 08/02/2023   11:57 AM 12/02/2022    2:15 PM  BP/Weight  Systolic BP 107 133 91  Diastolic BP 69 72 61  Wt. (Lbs) 132.2  145.4  BMI 24.98 kg/m2  27.47 kg/m2      Physical Exam Constitutional:      Appearance: She is well-developed.  Cardiovascular:     Rate and Rhythm: Normal rate.     Heart sounds: Normal heart sounds. No murmur heard. Pulmonary:     Effort: Pulmonary effort is normal.     Breath sounds: Normal breath sounds. No wheezing or rales.  Chest:     Chest wall: No tenderness.  Abdominal:     General: Bowel sounds are normal. There is no distension.     Palpations: Abdomen is soft. There is no mass.     Tenderness: There is no abdominal tenderness.  Musculoskeletal:        General: Normal range of motion.     Right lower leg: No edema.     Left lower leg: No edema.  Neurological:     Mental Status: She is alert and oriented to person, place, and time.  Psychiatric:        Mood and Affect: Mood normal.        Latest Ref Rng & Units 07/31/2022   12:27 PM 04/01/2022    4:03 PM 11/28/2021   11:21 AM  CMP  Glucose 70 - 99 mg/dL 67  87  89   BUN 6 - 20 mg/dL 12  13  17    Creatinine 0.57 - 1.00 mg/dL 7.82  9.56  2.13   Sodium 134 - 144 mmol/L 136  136  140   Potassium 3.5 - 5.2 mmol/L 4.2  3.6  4.4   Chloride 96 - 106 mmol/L 103  104  103   CO2 20 - 29 mmol/L 22  24  21    Calcium 8.7 - 10.2 mg/dL 8.8  9.5  9.3   Total Protein 6.0 - 8.5 g/dL 5.7  6.9  7.2   Total Bilirubin 0.0 - 1.2 mg/dL <0.8  0.5  0.5   Alkaline Phos 44 - 121 IU/L 57  35  61   AST 0 - 40 IU/L 13  16  16    ALT 0 - 32 IU/L 10  14  12      Lipid Panel  No results found for: "CHOL", "TRIG", "HDL", "CHOLHDL", "VLDL",  "LDLCALC", "LDLDIRECT"  CBC    Component Value Date/Time   WBC 9.8 11/02/2022 0535  RBC 3.27 (L) 11/02/2022 0535   HGB 9.9 (L) 11/02/2022 0535   HGB 11.4 08/24/2022 1515   HCT 28.8 (L) 11/02/2022 0535   HCT 34.9 08/24/2022 1515   PLT 140 (L) 11/02/2022 0535   PLT 158 08/24/2022 1515   MCV 88.1 11/02/2022 0535   MCV 93 08/24/2022 1515   MCH 30.3 11/02/2022 0535   MCHC 34.4 11/02/2022 0535   RDW 15.1 11/02/2022 0535   RDW 12.4 08/24/2022 1515   LYMPHSABS 1.5 04/28/2022 0934   MONOABS 0.6 04/01/2022 1603   EOSABS 0.1 04/28/2022 0934   BASOSABS 0.0 04/28/2022 0934    No results found for: "HGBA1C"  Assessment & Plan:      Migraine Daily moderate headaches with associated ear pain and dizziness. Symptoms are exacerbated by light and are associated with nausea. No sinus symptoms reported. History of similar symptoms two years ago. -Start Topamax for migraine prevention, beginning with one tablet daily for one week, then increasing to one tablet twice daily. -Placed on OCP due to teratogenicity of Topamax.  Vertigo -Initiate meclizine -Will also send a CBC to evaluate dizziness.  Otalgia -Use over-the-counter wax removing ear drops for right ear. -Consider over-the-counter sinus medication such as Claritin to potentially alleviate ear pressure. -Check blood work to rule out anemia or infection and assess kidney and liver function. -Follow-up appointment to assess improvement.  Dental Health Request for dental referral for check-up and potential work. -Place referral for dental visit.   Need for OCP initiation -OCP initiated due to the fact that she will be on Topamax.    Meds ordered this encounter  Medications   meclizine (ANTIVERT) 25 MG tablet    Sig: Take 1 tablet (25 mg total) by mouth 3 (three) times daily as needed for dizziness (vertigo).    Dispense:  30 tablet    Refill:  0   topiramate (TOPAMAX) 25 MG tablet    Sig: Take 1 tablet (25 mg total) by  mouth daily for 7 days, THEN 1 tablet (25 mg total) 2 (two) times daily.    Dispense:  67 tablet    Refill:  3   norethindrone (ORTHO MICRONOR) 0.35 MG tablet    Sig: Take 1 tablet (0.35 mg total) by mouth daily.    Dispense:  28 tablet    Refill:  6    Follow-up: Return in about 3 months (around 12/29/2023).       Hoy Register, MD, FAAFP. [facility] [place], [place] [phone]   09/28/2023, 9:31 AM

## 2023-09-29 LAB — CMP14+EGFR
ALT: 14 [IU]/L (ref 0–32)
AST: 16 [IU]/L (ref 0–40)
Albumin: 4.4 g/dL (ref 3.9–4.9)
Alkaline Phosphatase: 65 [IU]/L (ref 44–121)
BUN/Creatinine Ratio: 18 (ref 9–23)
BUN: 11 mg/dL (ref 6–20)
Bilirubin Total: 0.3 mg/dL (ref 0.0–1.2)
CO2: 23 mmol/L (ref 20–29)
Calcium: 9.5 mg/dL (ref 8.7–10.2)
Chloride: 103 mmol/L (ref 96–106)
Creatinine, Ser: 0.61 mg/dL (ref 0.57–1.00)
Globulin, Total: 2.7 g/dL (ref 1.5–4.5)
Glucose: 100 mg/dL — ABNORMAL HIGH (ref 70–99)
Potassium: 4.1 mmol/L (ref 3.5–5.2)
Sodium: 140 mmol/L (ref 134–144)
Total Protein: 7.1 g/dL (ref 6.0–8.5)
eGFR: 118 mL/min/{1.73_m2} (ref >59)

## 2023-09-29 LAB — CBC WITH DIFFERENTIAL/PLATELET
Basophils Absolute: 0 10*3/uL (ref 0.0–0.2)
Basos: 1 %
EOS (ABSOLUTE): 0.1 10*3/uL (ref 0.0–0.4)
Eos: 2 %
Hematocrit: 43.9 % (ref 34.0–46.6)
Hemoglobin: 13.9 g/dL (ref 11.1–15.9)
Immature Grans (Abs): 0 10*3/uL (ref 0.0–0.1)
Immature Granulocytes: 0 %
Lymphocytes Absolute: 2 10*3/uL (ref 0.7–3.1)
Lymphs: 36 %
MCH: 29.6 pg (ref 26.6–33.0)
MCHC: 31.7 g/dL (ref 31.5–35.7)
MCV: 94 fL (ref 79–97)
Monocytes Absolute: 0.4 10*3/uL (ref 0.1–0.9)
Monocytes: 7 %
Neutrophils Absolute: 3 10*3/uL (ref 1.4–7.0)
Neutrophils: 54 %
Platelets: 213 10*3/uL (ref 150–450)
RBC: 4.69 x10E6/uL (ref 3.77–5.28)
RDW: 12.4 % (ref 11.7–15.4)
WBC: 5.5 10*3/uL (ref 3.4–10.8)

## 2023-09-29 LAB — HEMOGLOBIN A1C
Est. average glucose Bld gHb Est-mCnc: 123 mg/dL
Hgb A1c MFr Bld: 5.9 % — ABNORMAL HIGH (ref 4.8–5.6)

## 2023-09-29 LAB — LP+NON-HDL CHOLESTEROL
Cholesterol, Total: 204 mg/dL — ABNORMAL HIGH (ref 100–199)
HDL: 46 mg/dL (ref >39)
LDL Chol Calc (NIH): 137 mg/dL — ABNORMAL HIGH (ref 0–99)
Total Non-HDL-Chol (LDL+VLDL): 158 mg/dL — ABNORMAL HIGH (ref 0–129)
Triglycerides: 116 mg/dL (ref 0–149)
VLDL Cholesterol Cal: 21 mg/dL (ref 5–40)

## 2023-10-08 ENCOUNTER — Other Ambulatory Visit: Payer: Self-pay

## 2023-11-03 ENCOUNTER — Telehealth: Payer: Self-pay

## 2023-11-03 NOTE — Telephone Encounter (Signed)
Copied from CRM [id number]. Topic: General - Inquiry >> Nov 03, 2023 11:23 AM Lennox Pippins wrote: Patient has called back stating this is her 3rd time in calling for a referral for a dentist. Patient states her teeth are bothering her and that she is waiting on the referral so an appointment can be scheduled. Patient states she has not received the list of dental resources.   Advised patient per the previous CRM that was completed that the list was mailed out to her this morning, 11/03/23. Please advise.  Patient is very upset and states she does not need a list of resources sent to her, she states that she just needs the referral, but no list.   Patient states "she has been trying to get the referral for a month now and the nurse has forgot to make the referral for her."  Please advise. Please contact patient back for this.  Patient wanted to schedule an appointment for this since she states calling is getting nowhere. Patient has been scheduled for 11/22/23 with [facility]  Patient's callback # [phone]

## 2023-11-04 NOTE — Telephone Encounter (Signed)
Per epic referral was placed in October and referral coordinator noted that dental listing was mailed to patient due to her not having insurance.

## 2023-11-22 ENCOUNTER — Encounter: Payer: Self-pay | Admitting: Physician Assistant

## 2023-11-22 ENCOUNTER — Ambulatory Visit: Payer: Self-pay | Attending: Physician Assistant | Admitting: Physician Assistant

## 2023-11-22 VITALS — BP 92/60 | HR 77 | Wt 151.8 lb

## 2023-11-22 DIAGNOSIS — M26609 Unspecified temporomandibular joint disorder, unspecified side: Secondary | ICD-10-CM

## 2023-11-22 DIAGNOSIS — G44229 Chronic tension-type headache, not intractable: Secondary | ICD-10-CM

## 2023-11-22 DIAGNOSIS — R42 Dizziness and giddiness: Secondary | ICD-10-CM

## 2023-11-22 DIAGNOSIS — Z758 Other problems related to medical facilities and other health care: Secondary | ICD-10-CM

## 2023-11-22 DIAGNOSIS — Z603 Acculturation difficulty: Secondary | ICD-10-CM

## 2023-11-22 MED ORDER — MECLIZINE HCL 25 MG PO TABS: 25.0000 mg | ORAL_TABLET | Freq: Three times a day (TID) | ORAL | 1 refills | Status: DC | PRN

## 2023-11-22 MED ORDER — TOPIRAMATE 50 MG PO TABS: 50.0000 mg | ORAL_TABLET | Freq: Two times a day (BID) | ORAL | 3 refills | Status: DC

## 2023-11-22 NOTE — Progress Notes (Signed)
Patient ID: Rebecca Schneider, female   DOB: August 30, 1986, 37 y.o.   MRN: [id number]   Rebecca Schneider, is a 37 y.o. female  NWG:[id number]  [id number]:[id number]  DOB - [birth year]  Chief Complaint  Patient presents with   Dental Pain   Migraine       Subjective:   Rebecca Schneider is a 37 y.o. female here today for continued C/o HA in the front of forehead for about 2 years.  Became more frequent  when pregnant  and since delivery.  HA are daily.  Topamax has made little difference.  No vision changes.  No thunder clap.  Labs are reassuring.  Continues having vertigo and wants RF on meclizine.  Also c/o jaw popping and locking causing pain daily.  Worse with yawning and eating big or solid items   No problems updated.  ALLERGIES: Allergies  Allergen Reactions   Ibuprofen Other (See Comments)    Possible headaches   Tylenol [Acetaminophen] Other (See Comments)    Headaches resulted    PAST MEDICAL HISTORY: Past Medical History:  Diagnosis Date   Hypotension    began after delivery of baby 11/01/14    MEDICATIONS AT HOME: Prior to Admission medications   Medication Sig Start Date End Date Taking? Authorizing Provider  topiramate (TOPAMAX) 50 MG tablet Take 1 tablet (50 mg total) by mouth 2 (two) times daily. 11/22/23  Yes Fleda Pagel M, PA-C  meclizine (ANTIVERT) 25 MG tablet Take 1 tablet (25 mg total) by mouth 3 (three) times daily as needed for dizziness (vertigo). 11/22/23   Anders Simmonds, PA-C  naproxen (NAPROSYN) 500 MG tablet Take 1 tablet (500 mg total) by mouth 2 (two) times daily as needed (pain). Patient not taking: Reported on 09/28/2023 08/02/23   Zenia Resides, MD  norethindrone (ORTHO MICRONOR) 0.35 MG tablet Take 1 tablet (0.35 mg total) by mouth daily. 09/28/23   Hoy Register, MD  tiZANidine (ZANAFLEX) 4 MG tablet Take 1 tablet (4 mg total) by mouth every 8 (eight) hours as needed for muscle spasms. Patient not taking: Reported on  09/28/2023 08/02/23   Zenia Resides, MD    ROS: Neg HEENT Neg resp Neg cardiac Neg GI Neg GU Neg psych  Objective:   Vitals:   11/22/23 1503  BP: 92/60  Pulse: 77  SpO2: 97%  Weight: 151 lb 12.8 oz (68.9 kg)   Exam General appearance : Awake, alert, not in any distress. Speech Clear. Not toxic looking HEENT: Atraumatic and Normocephalic, pupils equally reactive to light and accomodation, EOM intact.  Fundi benign.  L mandible clicks and slides out of place with opening mouth.  Some evidence of bruxism Neck: Supple, no JVD. No cervical lymphadenopathy.  Chest: Good air entry bilaterally, CTAB.  No rales/rhonchi/wheezing CVS: S1 S2 regular, no murmurs.  Extremities: B/L Lower Ext shows no edema, both legs are warm to touch Neurology: Awake alert, and oriented X 3, CN II-XII intact, Non focal Skin: No Rash  Data Review Lab Results  Component Value Date   HGBA1C 5.9 (H) 09/28/2023    Assessment & Plan   1. Vertigo (Primary) - meclizine (ANTIVERT) 25 MG tablet; Take 1 tablet (25 mg total) by mouth 3 (three) times daily as needed for dizziness (vertigo).  Dispense: 50 tablet; Refill: 1  2. Chronic tension-type headache, not intractable Calls "migraines" but not formally diagnosed  - Ambulatory referral to Dentistry - Ambulatory referral to Neurology - topiramate (TOPAMAX) 50 MG tablet; Take 1  tablet (50 mg total) by mouth 2 (two) times daily.  Dispense: 60 tablet; Refill: 3  3. TMJ (temporomandibular joint syndrome) Show pics of bite blocks - Ambulatory referral to Dentistry  4. Language barrier [employer] "Ignacia Bayley" interpreters used and additional time performing visit was required.     Return in about 3 months (around 02/20/2024) for PCP for chronic conditions-[place].  The patient was given clear instructions to go to ER or return to medical center if symptoms don't improve, worsen or new problems develop. The patient verbalized understanding. The patient was told to  call to get lab results if they haven't heard anything in the next week.      Georgian Co, PA-C [facility] [place], [place] [phone]   11/22/2023, 3:20 PM

## 2023-11-22 NOTE — Patient Instructions (Addendum)
64 to 80 ounces water daily  Sndrome de la articulacin temporomandibular Temporomandibular Joint Syndrome  El sndrome de Nurse, learning disability temporomandibular (sndrome de ATM) es una afeccin que causa dolor en las articulaciones temporomandibulares. Estas articulaciones estn ubicadas cerca de las orejas y permiten abrir y Conservation officer, nature. Las Engineer, petroleum por el sndrome de ATM pueden tener dificultades o sentir dolor al Product manager, morder o Radio producer otros movimientos con la [place]. El sndrome de ATM suele ser leve y desaparece en unas pocas semanas. En ocasiones, sin embargo, la afeccin se Land en un problema a Air cabin crew (crnico). Cules son las causas? Esta afeccin puede ser causada por lo siguiente: Rechinar los dientes o apretar la [place]. Algunas personas lo hacen cuando estn estresadas. Artritis. Una lesin mandibular. Una lesin en la cabeza o el cuello. Piezas dentales o dentaduras postizas que no estn bien alineadas. En algunos casos, es posible que la causa de este sndrome no se conozca. Cules son los signos o los sntomas? El sntoma ms comn de esta afeccin es un dolor continuo en el lado de la cabeza, en la zona de la articulacin temporomandibular. Otros sntomas pueden incluir: Dolor al [employer], por ejemplo, al [employer] o morder. Imposibilidad de abrir [employer]. Producir un chasquido al abrir la boca. Dolor de [place]. Dolor de odos. Dolor en el cuello o el hombro. Cmo se diagnostica? Esta afeccin se puede diagnosticar en funcin de lo siguiente: Los sntomas y los antecedentes mdicos. Un examen fsico. El mdico puede revisar el rango de movimiento de la [place]. Pruebas de diagnstico por imgenes, como radiografas o una resonancia magntica (RM). Tal vez deba consultar al dentista, quien revisar si las piezas dentales y la mandbula estn alineadas correctamente. Cmo se trata? El sndrome de  ATM suele desaparecer solo. Si se necesita tratamiento, este puede incluir lo siguiente: Consumir alimentos blandos y Contractor hielo o calor. Medicamentos para Engineer, materials o la inflamacin. Medicamentos o masajes para [employer]. Una frula dental, una placa de mordida o una boquilla para evitar que se rechinen los dientes o se aprieten las [place]. Tcnicas de relajacin o psicoterapia para ayudar a Museum/gallery exhibitions officer. Tratamiento para Chief Technology Officer en el que se aplica corriente Radio producer a los nervios a travs de la piel (estimulacin nerviosa elctrica transcutnea). Acupuntura. Esto puede ayudar a Engineer, materials. Ciruga de [place]. Esto debe realizarse en contadas ocasiones. Siga estas instrucciones en su casa:  Comida y bebida Consuma una dieta blanda si tiene dificultades para Product manager. No consuma los alimentos que Research scientist (physical sciences). No mastique goma de Theatre manager. Instrucciones generales Use los medicamentos de venta libre y los recetados solamente como se lo haya indicado el mdico. Si se lo indican, aplique hielo sobre la zona dolorida. Para hacer esto: Ponga el hielo en una bolsa plstica. Coloque una toalla entre la piel y Copy. Aplique el hielo durante 20 minutos, 2 o 3 veces por da. Retire el hielo si la piel se pone de color rojo brillante. Esto es [employer]. Si no puede sentir dolor, calor o fro, tiene un mayor riesgo de que se dae la zona. Aplique un pao mojado y tibio (compresa tibia) sobre la zona dolorida como se lo hayan indicado. Dese un masaje en la zona de la mandbula y haga los ejercicios de estiramiento como se lo haya indicado el mdico. Si le indicaron una frula dental, una placa de mordida o una boquilla, utilcela como se lo haya indicado  el mdico. Concurra a todas las visitas de seguimiento. Esto es importante. Dnde buscar ms informacin [employer] of Retail buyer (Instituto Nacional de  Investigacin Dental y Craneofacial): [email or web address] Comunquese con un mdico si: Tiene dificultad para comer. Tiene sntomas nuevos o sus sntomas empeoran. Solicite ayuda de inmediato si: Se le traba la mandbula. Resumen El sndrome de la articulacin temporomandibular (sndrome de ATM) es una afeccin que causa dolor en las articulaciones temporomandibulares. Estas articulaciones estn ubicadas cerca de las orejas y permiten abrir y Conservation officer, nature. El sndrome de ATM suele ser leve y desaparece en unas pocas semanas. En ocasiones, sin embargo, la afeccin se Land en un problema a Air cabin crew (crnico). Los sntomas incluyen un dolor continuo en el lado de la cabeza, en la zona de la articulacin temporomandibular, dolor al [employer] o morder e incapacidad para abrir la mandbula totalmente. Tambin puede producir un chasquido al abrir la boca. El sndrome de ATM suele desaparecer solo. En caso de Network engineer, este puede incluir medicamentos para Engineer, materials y reducir la inflamacin o para Armed forces logistics/support/administrative officer. Tambin pueden utilizarse una frula dental, una placa de mordida o una boquilla para evitar que se rechinen los dientes o se aprieten las mandbulas. Esta informacin no tiene Theme park manager el consejo del mdico. Asegrese de hacerle al mdico cualquier pregunta que tenga. Document Revised: 07/30/2021 Document Reviewed: 07/30/2021 Elsevier Patient Education  2024 [employer].

## 2023-12-07 ENCOUNTER — Telehealth: Payer: Self-pay | Admitting: Family Medicine

## 2023-12-29 ENCOUNTER — Ambulatory Visit: Payer: Self-pay | Admitting: Family Medicine

## 2024-01-28 ENCOUNTER — Other Ambulatory Visit: Payer: Self-pay | Admitting: Student

## 2024-01-31 ENCOUNTER — Other Ambulatory Visit: Payer: Self-pay | Admitting: Family Medicine

## 2024-01-31 NOTE — Telephone Encounter (Signed)
 Copied from CRM [id number]. Topic: Clinical - Medication Refill >> Jan 31, 2024  4:32 PM Brittney F wrote: Most Recent Primary Care Visit:  Provider: Anders Simmonds  Department: [facility]  Visit Type: OFFICE VISIT  Date: 11/22/2023  Patient is requesting a longer prescription than the previous 28-day supply as she knows she will be using the birth control medication for a long period of time.   Medication: [id number]  norethindrone (ORTHO MICRONOR) 0.35 MG tablet    Has the patient contacted their pharmacy? Yes   Is this the correct pharmacy for this prescription? Yes  If no, delete pharmacy and type the correct one.  This is the patient's preferred pharmacy:   [id number] - [place] ([place]), [place] - [address] [place] [address] Phone: [phone] Fax: [phone]   Has the prescription been filled recently? No  Is the patient out of the medication? Yes  Has the patient been seen for an appointment in the last year OR does the patient have an upcoming appointment? Yes  Can we respond through MyChart? No  Agent: Please be advised that Rx refills may take up to 3 business days. We ask that you follow-up with your pharmacy.

## 2024-02-01 NOTE — Telephone Encounter (Signed)
 Last Fill: This RN spoke to pharmacy staff, per pharmacy, script rcvd, not in stock but is being ordered for pt  No further action needed.  Routing to provider for review/authorization.

## 2024-02-22 ENCOUNTER — Ambulatory Visit: Payer: Self-pay | Attending: Family Medicine | Admitting: Family Medicine

## 2024-02-22 ENCOUNTER — Encounter: Payer: Self-pay | Admitting: Family Medicine

## 2024-02-22 VITALS — BP 98/64 | HR 74 | Ht 61.0 in | Wt 149.8 lb

## 2024-02-22 DIAGNOSIS — G43809 Other migraine, not intractable, without status migrainosus: Secondary | ICD-10-CM

## 2024-02-22 DIAGNOSIS — Z3041 Encounter for surveillance of contraceptive pills: Secondary | ICD-10-CM

## 2024-02-22 DIAGNOSIS — G44229 Chronic tension-type headache, not intractable: Secondary | ICD-10-CM

## 2024-02-22 DIAGNOSIS — E785 Hyperlipidemia, unspecified: Secondary | ICD-10-CM

## 2024-02-22 DIAGNOSIS — R42 Dizziness and giddiness: Secondary | ICD-10-CM

## 2024-02-22 MED ORDER — NORETHINDRONE 0.35 MG PO TABS: 1.0000 | ORAL_TABLET | Freq: Every day | ORAL | 11 refills | Status: DC

## 2024-02-22 MED ORDER — ATORVASTATIN CALCIUM 20 MG PO TABS: 20.0000 mg | ORAL_TABLET | Freq: Every day | ORAL | 1 refills | Status: DC

## 2024-02-22 NOTE — Patient Instructions (Signed)
Dislipidemia Dyslipidemia La dislipidemia es un desequilibrio de sustancias cerosas parecidas a la grasa (lpidos) en la sangre. El cuerpo necesita lpidos en pequeas cantidades. Con frecuencia, la dislipidemia implica un nivel alto de colesterol o triglicridos, que son tipos de lpidos. Las formas frecuentes de dislipidemia incluyen las siguientes: Niveles elevados de colesterol LDL. El LDL es el tipo de colesterol que causa la acumulacin de depsitos de grasa (placas) en los vasos sanguneos que transportan la sangre fuera del corazn (arterias). Niveles bajos de colesterol HDL. El HDL es el tipo de colesterol que brinda proteccin contra las enfermedades cardacas. Los niveles altos de HDL eliminan la acumulacin de LDL de las arterias. Niveles altos de triglicridos. Los triglicridos son una sustancia grasa presente en la sangre que se relaciona con la acumulacin de placa en las arterias. Cules son las causas? Hay dos tipos principales de dislipidemia: primaria y secundaria. La dislipidemia primaria es causada por cambios (mutaciones) en los genes que se transmiten a travs de las familias (se heredan). Estas mutaciones causan varios tipos de dislipidemia. La dislipidemia secundaria puede ser causada por diversos factores de riesgo que pueden provocar la enfermedad, como las opciones de estilo de vida y ciertas enfermedades. Qu incrementa el riesgo? Tiene ms probabilidades de desarrollar esta afeccin si es un hombre mayor o si es una mujer que ha pasado por la menopausia. Otros factores de riesgo son los siguientes: Tener antecedentes familiares de dislipidemia. Tomar determinados medicamentos, entre ellos, pldoras anticonceptivas, corticoesteroides, algunos diurticos y betabloqueantes. Seguir una dieta con alto contenido de grasas saturadas. Fumar cigarrillos o beber alcohol en exceso. Tener ciertas afecciones mdicas, como diabetes, sndrome de ovario poliqustico (SOP), enfermedad  renal, enfermedad heptica o hipotiroidismo. No hacer ejercicio regularmente. Tener sobrepeso o ser obeso con demasiada grasa en el abdomen. Cules son los signos o sntomas? En la mayora de los casos, la dislipidemia no causa ningn sntoma. En los casos graves, los niveles muy altos de lpidos pueden causar: Protuberancias de grasa debajo de la piel (xantomas). Un anillo blanco o gris alrededor del centro negro (pupila) del ojo. Los niveles muy altos de triglicridos pueden causar inflamacin del pncreas (pancreatitis). Cmo se diagnostica? Su mdico puede diagnosticar dislipidemia basndose en un anlisis de sangre de rutina (anlisis de sangre en ayunas). Como la mayora de las personas no tienen sntomas de la afeccin, este anlisis de sangre (perfil de lpidos) se realiza en adultos mayores de 20 aos y se repite cada 4 a 6 aos. En este anlisis, se controla lo siguiente: Colesterol total. Esto mide la cantidad total de colesterol en la sangre, que incluye el colesterol LDL, el colesterol HDL y los triglicridos. Un valor saludable est por debajo de 200 mg/dl (5.17 mmol/l). Colesterol LDL. El valor objetivo de colesterol LDL es diferente para cada persona, en funcin de los factores de riesgo individuales. Un valor saludable suele estar por debajo de 100 mg/dl (2.59 mmol/l). Consulte al mdico cul debe ser el valor del colesterol LDL para usted. Colesterol HDL. Un nivel de colesterol HDL de 60 mg/dl (1.55 mmol/l) o superior es lo mejor porque ayuda a proteger contra las enfermedades cardacas. Un valor por debajo de 40 mg/dl (1.03 mmol/l) para los hombres o por debajo de 50 mg/dl (1.29 mmol/l) para las mujeres aumenta el riesgo de enfermedad cardaca. Triglicridos. Un valor de triglicridos saludable est por debajo de 150 mg/dl (1.69 mmol/l). Si su perfil de lpidos es anormal, su mdico puede realizar otros anlisis de sangre. Cmo se trata? El tratamiento   depende del tipo de  dislipidemia que usted tenga y sus otros factores de riesgo de enfermedades cardacas o accidente cerebrovascular. Su mdico tendr un rango objetivo para sus niveles de lpidos en funcin de esta informacin. El tratamiento para la dislipidemia comienza con cambios en el estilo de vida, tales como dieta y ejercicio. El mdico podra recomendarle que haga lo siguiente: Hacer ejercicio con regularidad. Realizar cambios en la dieta. Si fuma, dejar de hacerlo. Limitar el consumo de bebidas alcohlicas. Si los cambios en la dieta y la actividad fsica no ayudan a alcanzar sus objetivos, el mdico tambin puede recetarle medicamentos para disminuir los lpidos. El tipo de medicamento recetado con ms frecuencia disminuye el colesterol LDL (estatinas). Si tiene un nivel alto de triglicridos, su mdico puede recetarle otro tipo de frmaco (fibratos) o un suplemento de aceite de pescado con omega-3, o ambos. Siga estas instrucciones en su casa: Comida y bebida  Siga las indicaciones del mdico o el nutricionista respecto de las restricciones para las comidas o las bebidas. Siga una dieta saludable como se lo haya indicado el mdico. Esto puede ayudarle a alcanzar y mantener un peso saludable, reducir el colesterol LDL y aumentar el colesterol HDL. Puede incluir: Limitar sus caloras, si tiene sobrepeso. Comer ms frutas, verduras, cereales integrales, pescado y carnes magras. Limitar las grasas saturadas, las grasas trans y el colesterol. No beba alcohol si: Su mdico le indica no hacerlo. Est embarazada, puede estar embarazada o est tratando de quedar embarazada. Si bebe alcohol: Limite la cantidad que bebe a lo siguiente: De 0 a 1 medida por da para las mujeres. De 0 a 2 medidas por da para los hombres. Sepa cunta cantidad de alcohol hay en las bebidas que toma. En los [place], una medida equivale a una botella de cerveza de 12 oz (355 ml), un vaso de vino de 5 oz (148 ml) o un vaso de  una bebida alcohlica de alta graduacin de 1 oz (44 ml). Actividad Haga ejercicio con regularidad. Siga un programa de ejercicio y entrenamiento de fuerza tal como se lo haya indicado el mdico. Pregntele al mdico qu actividades son seguras para usted. El mdico puede recomendarle lo siguiente: 30 minutos de actividad aerbica de 4 a 6 das por semana. La caminata a paso ligero es un ejemplo de actividad aerbica. Entrenamiento de fuerza 2 das por semana. Indicaciones generales No consuma ningn producto que contenga nicotina o tabaco. Estos productos incluyen cigarrillos, tabaco para mascar y aparatos de vapeo, como los cigarrillos electrnicos. Si necesita ayuda para dejar de consumir estos productos, consulte al mdico. Use los medicamentos de venta libre y los recetados solamente como se lo haya indicado el mdico. Esto incluye los suplementos. Concurra a todas las visitas de seguimiento. Esto es importante. Comunquese con un mdico si: Tiene dificultad para cumplir con su plan de actividad fsica o su dieta. Le cuesta dejar de fumar o controlar el consumo de alcohol. Resumen Con frecuencia, la dislipidemia implica un nivel alto de colesterol o triglicridos, que son tipos de lpidos. El tratamiento depende del tipo de dislipidemia que usted tenga y sus otros factores de riesgo de enfermedades cardacas o accidente cerebrovascular. El tratamiento para la dislipidemia comienza con cambios en el estilo de vida, tales como dieta y ejercicio. Su mdico puede recetarle medicamentos para disminuir los lpidos. Esta informacin no tiene como fin reemplazar el consejo del mdico. Asegrese de hacerle al mdico cualquier pregunta que tenga. Document Revised: 01/31/2021 Document Reviewed: 01/31/2021 Elsevier Patient   Education  2024 Elsevier Inc.  

## 2024-02-22 NOTE — Progress Notes (Signed)
 Subjective:  Patient ID: Rebecca Schneider, female    DOB: 06/09/1986  Age: 38 y.o. MRN: [id number]  CC: Medical Management of Chronic Issues     Discussed the use of AI scribe software for clinical note transcription with the patient, who gave verbal consent to proceed.  History of Present Illness The patient, with a history of migraines and high cholesterol, presents for follow-up. She reports that the Topamax prescribed for migraines has been beneficial, but she still experiences occasional vertigo. She also notes that a recent dental procedure to address a painful lower right molar seems to have alleviated concurrent ear pain, which she suspects may have been referred pain from the tooth issue. She is currently taking Topamax only when migraines are particularly severe, which occurs about once a week.  The patient also expresses concern about her high cholesterol levels, which she believes may be contributing to her migraines. She reports attempting to eat a healthier diet to manage her cholesterol and inquires about the possibility of medication to lower her cholesterol levels more quickly. She also requests a refill of her birth control medication.    Past Medical History:  Diagnosis Date   Hypotension    began after delivery of baby 11/01/14    History reviewed. No pertinent surgical history.  Family History  Problem Relation Age of Onset   Cancer Brother        neck   Diabetes Maternal Grandfather     Social History   Socioeconomic History   Marital status: Married    Spouse name: Not on file   Number of children: Not on file   Years of education: Not on file   Highest education level: Not on file  Occupational History   Not on file  Tobacco Use   Smoking status: Never   Smokeless tobacco: Never  Vaping Use   Vaping status: Never Used  Substance and Sexual Activity   Alcohol use: No   Drug use: No   Sexual activity: Yes    Birth control/protection:  Pill  Other Topics Concern   Not on file  Social History Narrative   Not on file   Social Drivers of Health   Financial Resource Strain: Not on file  Food Insecurity: No Food Insecurity (11/01/2022)   Hunger Vital Sign    Worried About Running Out of Food in the Last Year: Never true    Ran Out of Food in the Last Year: Never true  Transportation Needs: No Transportation Needs (11/01/2022)   PRAPARE - Administrator, Civil Service (Medical): No    Lack of Transportation (Non-Medical): No  Physical Activity: Not on file  Stress: Not on file  Social Connections: Not on file    Allergies  Allergen Reactions   Ibuprofen Other (See Comments)    Possible headaches   Tylenol [Acetaminophen] Other (See Comments)    Headaches resulted    Outpatient Medications Prior to Visit  Medication Sig Dispense Refill   norethindrone (MICRONOR) 0.35 MG tablet TAKE 1 TABLET BY MOUTH ONCE DAILY START  TAKING  3  WEEKS  AFTER  DELIVERY  OF  BABY 28 tablet 0   meclizine (ANTIVERT) 25 MG tablet Take 1 tablet (25 mg total) by mouth 3 (three) times daily as needed for dizziness (vertigo). (Patient not taking: Reported on 02/22/2024) 50 tablet 1   naproxen (NAPROSYN) 500 MG tablet Take 1 tablet (500 mg total) by mouth 2 (two) times daily as needed (pain). (  Patient not taking: Reported on 02/22/2024) 30 tablet 0   tiZANidine (ZANAFLEX) 4 MG tablet Take 1 tablet (4 mg total) by mouth every 8 (eight) hours as needed for muscle spasms. (Patient not taking: Reported on 02/22/2024) 15 tablet 0   topiramate (TOPAMAX) 50 MG tablet Take 1 tablet (50 mg total) by mouth 2 (two) times daily. (Patient not taking: Reported on 02/22/2024) 60 tablet 3   No facility-administered medications prior to visit.     ROS Review of Systems  Constitutional:  Negative for activity change and appetite change.  HENT:  Negative for sinus pressure and sore throat.   Respiratory:  Negative for chest tightness, shortness of  breath and wheezing.   Cardiovascular:  Negative for chest pain and palpitations.  Gastrointestinal:  Negative for abdominal distention, abdominal pain and constipation.  Genitourinary: Negative.   Musculoskeletal: Negative.   Psychiatric/Behavioral:  Negative for behavioral problems and dysphoric mood.     Objective:  BP 98/64   Pulse 74   Ht 5\' 1"  (1.549 m)   Wt 149 lb 12.8 oz (67.9 kg)   SpO2 98%   BMI 28.30 kg/m      02/22/2024   11:08 AM 11/22/2023    3:03 PM 09/28/2023    8:55 AM  BP/Weight  Systolic BP 98 92 107  Diastolic BP 64 60 69  Wt. (Lbs) 149.8 151.8 132.2  BMI 28.3 kg/m2 28.68 kg/m2 24.98 kg/m2      Physical Exam Constitutional:      Appearance: She is well-developed.  Cardiovascular:     Rate and Rhythm: Normal rate.     Heart sounds: Normal heart sounds. No murmur heard. Pulmonary:     Effort: Pulmonary effort is normal.     Breath sounds: Normal breath sounds. No wheezing or rales.  Chest:     Chest wall: No tenderness.  Abdominal:     General: Bowel sounds are normal. There is no distension.     Palpations: Abdomen is soft. There is no mass.     Tenderness: There is no abdominal tenderness.  Musculoskeletal:        General: Normal range of motion.     Right lower leg: No edema.     Left lower leg: No edema.  Neurological:     Mental Status: She is alert and oriented to person, place, and time.  Psychiatric:        Mood and Affect: Mood normal.        Latest Ref Rng & Units 09/28/2023    9:40 AM 07/31/2022   12:27 PM 04/01/2022    4:03 PM  CMP  Glucose 70 - 99 mg/dL 161  67  87   BUN 6 - 20 mg/dL 11  12  13    Creatinine 0.57 - 1.00 mg/dL 0.96  0.45  4.09   Sodium 134 - 144 mmol/L 140  136  136   Potassium 3.5 - 5.2 mmol/L 4.1  4.2  3.6   Chloride 96 - 106 mmol/L 103  103  104   CO2 20 - 29 mmol/L 23  22  24    Calcium 8.7 - 10.2 mg/dL 9.5  8.8  9.5   Total Protein 6.0 - 8.5 g/dL 7.1  5.7  6.9   Total Bilirubin 0.0 - 1.2 mg/dL 0.3   <8.1  0.5   Alkaline Phos 44 - 121 IU/L 65  57  35   AST 0 - 40 IU/L 16  13  16  ALT 0 - 32 IU/L 14  10  14      Lipid Panel     Component Value Date/Time   CHOL 204 (H) 09/28/2023 0940   TRIG 116 09/28/2023 0940   HDL 46 09/28/2023 0940   LDLCALC 137 (H) 09/28/2023 0940    CBC    Component Value Date/Time   WBC 5.5 09/28/2023 0940   WBC 9.8 11/02/2022 0535   RBC 4.69 09/28/2023 0940   RBC 3.27 (L) 11/02/2022 0535   HGB 13.9 09/28/2023 0940   HCT 43.9 09/28/2023 0940   PLT 213 09/28/2023 0940   MCV 94 09/28/2023 0940   MCH 29.6 09/28/2023 0940   MCH 30.3 11/02/2022 0535   MCHC 31.7 09/28/2023 0940   MCHC 34.4 11/02/2022 0535   RDW 12.4 09/28/2023 0940   LYMPHSABS 2.0 09/28/2023 0940   MONOABS 0.6 04/01/2022 1603   EOSABS 0.1 09/28/2023 0940   BASOSABS 0.0 09/28/2023 0940    Lab Results  Component Value Date   HGBA1C 5.9 (H) 09/28/2023       Assessment & Plan Migraine Experiences migraines once a week. Topamax not necessary due to infrequency. Excedrin recommended for relief. - Discontinue Topamax. - Recommend Excedrin for migraine relief as needed.  Vertigo Occasional vertigo improved. Discontinued medication as no longer needed. - Discontinue medication for vertigo.   Hyperlipidemia High cholesterol unrelated to headaches.  -Low cardiovascular but patient would like to be placed on a cholesterol medication  -Placed on atorvastatin. - Schedule follow-up blood test in six months to re-evaluate cholesterol levels. - Advised on diet for cholesterol management.  Oral contraceptive surveillance - Refill birth control prescription as needed. - Advise on maintaining a healthy diet.      Meds ordered this encounter  Medications   norethindrone (MICRONOR) 0.35 MG tablet    Sig: Take 1 tablet (0.35 mg total) by mouth daily.    Dispense:  28 tablet    Refill:  11   atorvastatin (LIPITOR) 20 MG tablet    Sig: Take 1 tablet (20 mg total) by mouth daily.     Dispense:  90 tablet    Refill:  1    Follow-up: Return in about 6 months (around 08/24/2024) for hyperlipidemia.       Hoy Register, MD, FAAFP. [facility] [place], [place] [phone]   02/22/2024, 12:21 PM

## 2024-02-24 NOTE — Progress Notes (Deleted)
 NEUROLOGY CONSULTATION NOTE  Rebecca Schneider MRN: [id number] DOB: 1986/04/26  Referring provider: Georgian Co, PA-C Primary care provider: Hoy Register, MD  Reason for consult:  headaches  Assessment/Plan:   ***   Subjective:  Rebecca Schneider is a 38 year old ***-handed female who presents for headaches.  History supplemented by Urgent Care and primary care notes.  She has been experiencing headaches ***  She does report that her jaw ***  She also has been experiencing episodes of dizziness.  Bilateral ear pain, tinnitus  Past NSAIDS/analgesics:  naproxen Past abortive triptans:  *** Past abortive ergotamine:  *** Past muscle relaxants:  tizanidine Past anti-emetic:  ondansetron 4mg , metroclopramide Past antihypertensive medications:  *** Past antidepressant medications:  sertraline Past anticonvulsant medications:  topiramate Past anti-CGRP:  *** Past vitamins/Herbal/Supplements:  *** Past antihistamines/decongestants:  meclizine, diphenhydramine Other past therapies:  ***  Current NSAIDS/analgesics:  *** Current triptans:  none Current ergotamine:  none Current anti-emetic:  none Current muscle relaxants:  none Current Antihypertensive medications:  none Current Antidepressant medications:  none Current Anticonvulsant medications:  none Current anti-CGRP:  none Current Vitamins/Herbal/Supplements:  none Current Antihistamines/Decongestants:  none Other therapy:  none Birth control:  norrethindrone   Caffeine:  *** Alcohol:  *** Smoker:  *** Diet:  *** Exercise:  *** Depression:  ***; Anxiety:  *** Other pain:  *** Sleep hygiene:  *** Family history of headache:  ***      PAST MEDICAL HISTORY: Past Medical History:  Diagnosis Date   Hypotension    began after delivery of baby 11/01/14    PAST SURGICAL HISTORY: No past surgical history on file.  MEDICATIONS: Current Outpatient Medications on File Prior to Visit   Medication Sig Dispense Refill   atorvastatin (LIPITOR) 20 MG tablet Take 1 tablet (20 mg total) by mouth daily. 90 tablet 1   norethindrone (MICRONOR) 0.35 MG tablet Take 1 tablet (0.35 mg total) by mouth daily. 28 tablet 11   No current facility-administered medications on file prior to visit.    ALLERGIES: Allergies  Allergen Reactions   Ibuprofen Other (See Comments)    Possible headaches   Tylenol [Acetaminophen] Other (See Comments)    Headaches resulted    FAMILY HISTORY: Family History  Problem Relation Age of Onset   Cancer Brother        neck   Diabetes Maternal Grandfather     Objective:  *** General: No acute distress.  Patient appears well-groomed.   Head:  Normocephalic/atraumatic Eyes:  fundi examined but not visualized Neck: supple, no paraspinal tenderness, full range of motion Back: No paraspinal tenderness Heart: regular rate and rhythm Lungs: Clear to auscultation bilaterally. Vascular: No carotid bruits. Neurological Exam: Mental status: alert and oriented to person, place, and time, speech fluent and not dysarthric, language intact. Cranial nerves: CN I: not tested CN II: pupils equal, round and reactive to light, visual fields intact CN III, IV, VI:  full range of motion, no nystagmus, no ptosis CN V: facial sensation intact. CN VII: upper and lower face symmetric CN VIII: hearing intact CN IX, X: gag intact, uvula midline CN XI: sternocleidomastoid and trapezius muscles intact CN XII: tongue midline Bulk & Tone: normal, no fasciculations. Motor:  muscle strength 5/5 throughout Sensation:  Pinprick, temperature and vibratory sensation intact. Deep Tendon Reflexes:  2+ throughout,  toes downgoing.   Finger to nose testing:  Without dysmetria.   Heel to shin:  Without dysmetria.   Gait:  Normal station and  stride.  Romberg negative.    Thank you for allowing me to take part in the care of this patient.  Shon Millet, DO  CC:  ***

## 2024-02-28 ENCOUNTER — Ambulatory Visit: Payer: Self-pay | Admitting: Neurology

## 2024-04-28 ENCOUNTER — Encounter: Payer: Self-pay | Admitting: Neurology

## 2024-04-28 ENCOUNTER — Ambulatory Visit (INDEPENDENT_AMBULATORY_CARE_PROVIDER_SITE_OTHER): Payer: Self-pay | Admitting: Neurology

## 2024-04-28 VITALS — BP 100/62 | HR 60 | Ht 61.0 in | Wt 150.0 lb

## 2024-04-28 DIAGNOSIS — G43109 Migraine with aura, not intractable, without status migrainosus: Secondary | ICD-10-CM

## 2024-04-28 DIAGNOSIS — M26609 Unspecified temporomandibular joint disorder, unspecified side: Secondary | ICD-10-CM

## 2024-04-28 DIAGNOSIS — G43809 Other migraine, not intractable, without status migrainosus: Secondary | ICD-10-CM

## 2024-04-28 MED ORDER — NORTRIPTYLINE HCL 10 MG PO CAPS: 10.0000 mg | ORAL_CAPSULE | Freq: Every day | ORAL | 5 refills | Status: DC

## 2024-04-28 MED ORDER — SUMATRIPTAN SUCCINATE 50 MG PO TABS: 50.0000 mg | ORAL_TABLET | ORAL | 5 refills | Status: DC | PRN

## 2024-04-28 NOTE — Patient Instructions (Signed)
 Start nortriptyline  10mg  at bedtime.  If no improvement in headaches in 4 weeks, contact me and we can increase dose Take sumatriptan  at earliest onset of the severe headache.  May repeat after 2 hours.  Maximum 2 tablets in 24 hours.   Also take the meclizine  for the vertigo attacks.  Contact me for refills. Continue physical therapy for the jaws.   Follow up in 6 months.    Luxacin de la mandbula Jaw Dislocation  La luxacin de la mandbula se produce cuando la articulacin donde se unen los huesos de la mandbula se desplaza de Nature conservation officer (se disloca). Esto puede [employer]. Es posible que tenga dificultades para mover la boca y la [place]. Cules son las causas? Un golpe directo o lesin en la mandbula. Abrir la boca [employer]. Esto puede suceder si: Come, bosteza, se re o vomita. Le realizan un procedimiento dental. Toma un medicamento por va oral que lo hizo dormir (anestesia general). Tiene una convulsin. Tiene un tipo de efecto secundario de ciertos medicamentos. Qu incrementa el riesgo? Se le ha desplazado la mandbula antes. Practica deportes de contacto. Tiene la mandbula floja o puede moverla ms de lo que es normal para la mayora de [employer]. Tiene una afeccin que [employer] ligamentos de la [place]. Estos son tejidos que [employer] s. Cules son los signos o sntomas? Entre los sntomas, se pueden incluir los siguientes: Dolor muy intenso, en especial cuando trata de mover la [place]. No poder mover la mandbula por completo o en absoluto. No poder cerrar la boca por completo o en absoluto. Sensacin de que no se le [employer] dientes como lo hacen normalmente al morder. Babeo. Dificultad para hablar o tragar. Cmo se trata? El mdico puede reacomodarle la mandbula en su lugar (reduccin manual). Es posible que le administren analgsicos antes de que el mdico le acomode la [place]. Medicamentos. Un dispositivo  ortopdico para el cuello. Un vendaje alrededor de la cabeza y la [place]. Siga estas instrucciones en su casa: Comida y bebida Siga las instrucciones del mdico respecto de lo que no puede comer o beber Engineer, manufacturing systems se le cura la [place]. Le pedirn que: Coma alimentos blandos. Coma alimentos lquidos. Coma alimentos cortados en trozos pequeos. Durante los [address], asegrese de tomar solo bocados pequeos con mucho cuidado. Trate de no abrir grande la mandbula cuando coma. Si tiene un dispositivo ortopdico o una venda: Use el dispositivo ortopdico o la venda como se lo haya indicado el mdico. Aeronautical engineer solamente como se lo haya indicado el mdico. Controle todos los das la piel alrededor del dispositivo ortopdico o la venda. Comunquese con su mdico si ve algn problema. Afloje el dispositivo ortopdico o la venda si siente la mandbula adormecida. Mantenga el dispositivo ortopdico o la venda limpios. Si el dispositivo ortopdico o la venda no son impermeables: No deje que se mojen. Cbralos con un envoltorio hermtico cuando tome un bao de inmersin o una ducha. Control del dolor, la rigidez y la hinchazn Aplique hielo sobre la zona lesionada si se lo indican. Para hacer esto: Si tiene un dispositivo ortopdico o una venda, quteselos como se lo haya indicado el mdico. Ponga el hielo en una bolsa plstica. Coloque una toalla entre la piel y la bolsa. Aplique el hielo durante 20 minutos, 2 o 3 veces por da. Retire el hielo si la piel se le pone de color rojo brillante. Esto es [employer]. Si no puede sentir dolor, calor o fro,  tiene un mayor riesgo de que se dae la zona.  Actividad No abra mucho la boca hasta que el mdico lo autorice. Mantenga la [employer] en reposo como se lo haya indicado el mdico. Evite las actividades similares a la que le produjo la lesin. Trate de no abrir grande la mandbula cuando se ra o bostece. Cuando estornude o bostece, coloque  una mano debajo de la [place]. Instrucciones generales Use los medicamentos de venta libre y los recetados solamente como se lo haya indicado el mdico. No tome baos de inmersin, no practique natacin ni utilice el jacuzzi. Pregunte a su mdico si puede ducharse o darse baos de esponja. No fume ni consuma ningn producto que contenga nicotina o tabaco. Si necesita ayuda para dejar de fumar, consulte al mdico. Concurra a todas las visitas de seguimiento. Comunquese con un mdico si: El Product/process development scientist. Los medicamentos no alivian el dolor. Solicite ayuda inmediatamente si: La Financial risk analyst. Le resulta difcil respirar, comer o tragar. Estos sntomas pueden Customer service manager. Solicite ayuda inmediatamente. Comunquese con el servicio de emergencias de su localidad (911 en los [place]). No espere a ver si los sntomas desaparecen. No conduzca por sus propios medios [employer]. Resumen La luxacin de la mandbula se produce cuando la articulacin donde se unen los huesos de la mandbula se desplaza de Nature conservation officer (se disloca). Cuando la [employer] se sale de Nature conservation officer, puede Surveyor, mining. Es posible que tenga dificultades para mover la boca y la [place]. El mdico puede reacomodarle la mandbula en su lugar (reduccin manual). Es posible que le administren analgsicos antes de que le acomoden la [place]. Esta informacin no tiene Theme park manager el consejo del mdico. Asegrese de hacerle al mdico cualquier pregunta que tenga. Document Revised: 03/27/2021 Document Reviewed: 03/27/2021 Elsevier Patient Education  2024 [employer].

## 2024-04-28 NOTE — Progress Notes (Signed)
 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  We can increase to 25mg  at bedtime in 4 weeks if needed. Migraine rescue:  sumatriptan 50mg .  May take meclizine  as needed for vertigo Continue physical therapy for the jaw.  If ineffective, I recommend that she should be re-evaluated (by a dentist or oral surgeon) for other potential treatment.  Total time spent in chart and face to face with patient:  60 minutes  Subjective:  Rebecca Schneider is a 38 year old right-handed female who presents for headache.  History supplemented by referring provider's note.  She is accompanied by a Spanish-speaking interpreter.  She began experiencing bilateral ear pain and ringing during her pregnancy in 2023.  Following delivery of her baby, the ear pain continued, described as a stabbing pain in both ears and associated with ringing.  Ears sometimes feels stuffed, sometimes hears cracking sound in ears.  No hearing loss.  She also has an aching on the top of her head.  She also has bilateral jaw pain.  She has episodes of severe headache where she will have stabbing pain radiating from the top of her head back to the left side of her neck.  Sometimes she has diffuse headache like a "brain freeze".  She has associated vertigo described as spinning sensation with movement.  She also endorses watery eyes, photophobia and phonophobia.  Cold aggravates it, so if it is cold outside, she needs to cover her ears and head.  Therefore, they occur less during the summer months.  This lasts for 8 days once a month.  There is an association with  her menstrual cycle.  She did see a dentist who prescribed physical therapy for her jaw which has somewhat helped.    Denies prior history of headaches before 2023.  Past NSAIDS/analgesics:  ibuprofen , naproxen , acetaminophen Past abortive triptans:  none Past abortive ergotamine:  none Past muscle relaxants:  tizanidine  Past anti-emetic:  metoclopramide , ondansetron  Past antihypertensive medications:  none Past antidepressant medications:  sertraline  Past anticonvulsant medications:  topiramate  50mg  BID  Past anti-CGRP:  none Past vitamins/Herbal/Supplements:  none Past antihistamines/decongestants:  meclizine  25mg , diphenhydramine  Other past therapies:  none  Current NSAIDS/analgesics:  none Current triptans:  none Current ergotamine:  none Current anti-emetic:  none Current muscle relaxants:  none Current Antihypertensive medications:  none Current Antidepressant medications:  none Current Anticonvulsant medications:  none Current anti-CGRP:  none Current Vitamins/Herbal/Supplements:  none Current Antihistamines/Decongestants:  none Other therapy:  none Birth control/hormone:  norethindrone    PAST MEDICAL HISTORY: Past Medical History:  Diagnosis Date   Hypotension    began after delivery of baby 11/01/14    PAST SURGICAL HISTORY: No past surgical history on file.  MEDICATIONS: Current Outpatient Medications on File Prior to Visit  Medication Sig Dispense Refill   atorvastatin  (LIPITOR) 20 MG tablet Take 1 tablet (20 mg total) by mouth daily. 90 tablet 1   norethindrone  (MICRONOR ) 0.35 MG tablet Take 1 tablet (0.35 mg total) by mouth daily. 28 tablet 11   No current facility-administered medications on file prior to visit.    ALLERGIES: Allergies  Allergen Reactions   Ibuprofen  Other (See Comments)    Possible headaches  Tylenol [Acetaminophen] Other (See Comments)    Headaches resulted    FAMILY HISTORY: Family History  Problem Relation Age of Onset    Cancer Brother        neck   Diabetes Maternal Grandfather     Objective:  Blood pressure 100/62, pulse 60, height 5\' 1"  (1.549 m), weight 150 lb (68 kg), last menstrual period 04/19/2024, SpO2 96%, not currently breastfeeding. General: No acute distress.  Patient appears well-groomed.   Head:  Normocephalic/atraumatic.  trismus Eyes:  fundi examined but not visualized Neck: supple, no paraspinal tenderness, full range of motion Heart: regular rate and rhythm Vascular: No carotid bruits. Neurological Exam: Mental status: alert and oriented to person, place, and time, speech fluent and not dysarthric, language intact. Cranial nerves: CN I: not tested CN II: pupils equal, round and reactive to light, visual fields intact CN III, IV, VI:  full range of motion, no nystagmus, no ptosis CN V: facial sensation intact. CN VII: upper and lower face symmetric CN VIII: hearing intact CN IX, X: gag intact, uvula midline CN XI: sternocleidomastoid and trapezius muscles intact CN XII: tongue midline Bulk & Tone: normal, no fasciculations. Motor:  muscle strength 5/5 throughout Sensation:  Pinprick sensation intact. Deep Tendon Reflexes:  2+ throughout,  toes downgoing.   Finger to nose testing:  Without dysmetria.   Gait:  Normal station and stride.  Romberg negative.    Thank you for allowing me to take part in the care of this patient.  Janne Members, DO  CC:  Joaquin Mulberry, MD   Dulce Gibbs, PA-C

## 2024-05-01 ENCOUNTER — Ambulatory Visit: Payer: Self-pay | Admitting: Neurology

## 2024-05-23 ENCOUNTER — Ambulatory Visit: Payer: Self-pay | Admitting: Neurology

## 2024-07-14 ENCOUNTER — Other Ambulatory Visit (HOSPITAL_BASED_OUTPATIENT_CLINIC_OR_DEPARTMENT_OTHER): Payer: Self-pay

## 2024-08-23 ENCOUNTER — Ambulatory Visit: Payer: Self-pay | Admitting: Family Medicine

## 2024-08-23 ENCOUNTER — Telehealth: Payer: Self-pay | Admitting: Family Medicine

## 2024-08-23 NOTE — Telephone Encounter (Signed)
 Pt confirmed appt 9/25 (per vr )

## 2024-08-24 ENCOUNTER — Ambulatory Visit: Payer: Self-pay | Attending: Family Medicine | Admitting: Family Medicine

## 2024-08-24 ENCOUNTER — Encounter: Payer: Self-pay | Admitting: Family Medicine

## 2024-08-24 VITALS — BP 96/66 | HR 74 | Ht 61.0 in | Wt 145.4 lb

## 2024-08-24 DIAGNOSIS — G43809 Other migraine, not intractable, without status migrainosus: Secondary | ICD-10-CM

## 2024-08-24 DIAGNOSIS — M533 Sacrococcygeal disorders, not elsewhere classified: Secondary | ICD-10-CM

## 2024-08-24 DIAGNOSIS — Z23 Encounter for immunization: Secondary | ICD-10-CM

## 2024-08-24 DIAGNOSIS — E785 Hyperlipidemia, unspecified: Secondary | ICD-10-CM

## 2024-08-24 MED ORDER — ATORVASTATIN CALCIUM 20 MG PO TABS: 20.0000 mg | ORAL_TABLET | Freq: Every day | ORAL | 1 refills | Status: AC

## 2024-08-24 NOTE — Progress Notes (Signed)
 Subjective:  Patient ID: Rebecca Schneider, female    DOB: 09/25/1986  Age: 38 y.o. MRN: [id number]  CC: Medical Management of Chronic Issues (Pain in tailbone)     Discussed the use of AI scribe software for clinical note transcription with the patient, who gave verbal consent to proceed.  History of Present Illness Rebecca Schneider is a 38 year old female with a history of migraines, hyperlipidemia who presents with chronic tailbone pain.  She has experienced tailbone pain for over two and a half years, beginning during pregnancy. The pain is persistent, partially relieved by ibuprofen , and can be severe enough to prevent standing. Intense pain causes instability in her feet. Sitting for long periods exacerbates the discomfort.  Urinary incontinence occurs, especially when sneezing or with urgency. There is no fecal incontinence. Occasionally, numbness and tingling occur in the right leg, particularly around her menstrual period. Pain sometimes radiates from the tailbone down her leg, causing a sensation of potential falling and necessitating support.  She feels swelling around her rectum, described as 'little lumps' that are swollen.    Past Medical History:  Diagnosis Date   Hypotension    began after delivery of baby 11/01/14    No past surgical history on file.  Family History  Problem Relation Age of Onset   Cancer Brother        neck   Diabetes Maternal Grandfather     Social History   Socioeconomic History   Marital status: Married    Spouse name: Not on file   Number of children: Not on file   Years of education: Not on file   Highest education level: Not on file  Occupational History   Not on file  Tobacco Use   Smoking status: Never   Smokeless tobacco: Never  Vaping Use   Vaping status: Never Used  Substance and Sexual Activity   Alcohol use: No   Drug use: No   Sexual activity: Yes    Birth control/protection: Pill  Other Topics  Concern   Not on file  Social History Narrative   Right handed   Social Drivers of Health   Financial Resource Strain: Not on file  Food Insecurity: No Food Insecurity (11/01/2022)   Hunger Vital Sign    Worried About Running Out of Food in the Last Year: Never true    Ran Out of Food in the Last Year: Never true  Transportation Needs: No Transportation Needs (11/01/2022)   PRAPARE - Administrator, Civil Service (Medical): No    Lack of Transportation (Non-Medical): No  Physical Activity: Not on file  Stress: Not on file  Social Connections: Not on file    Allergies  Allergen Reactions   Ibuprofen  Other (See Comments)    Possible headaches   Tylenol [Acetaminophen] Other (See Comments)    Headaches resulted    Outpatient Medications Prior to Visit  Medication Sig Dispense Refill   norethindrone  (MICRONOR ) 0.35 MG tablet Take 1 tablet (0.35 mg total) by mouth daily. 28 tablet 11   nortriptyline  (PAMELOR ) 10 MG capsule Take 1 capsule (10 mg total) by mouth at bedtime. 30 capsule 5   SUMAtriptan  (IMITREX ) 50 MG tablet Take 1 tablet (50 mg total) by mouth as needed for migraine. May repeat in 2 hours if headache persists or recurs.  Maximum 2 tablets in 24 hours. 10 tablet 5   atorvastatin  (LIPITOR) 20 MG tablet Take 1 tablet (20 mg total) by mouth daily.  90 tablet 1   No facility-administered medications prior to visit.     ROS Review of Systems  Constitutional:  Negative for activity change and appetite change.  HENT:  Negative for sinus pressure and sore throat.   Respiratory:  Negative for chest tightness, shortness of breath and wheezing.   Cardiovascular:  Negative for chest pain and palpitations.  Gastrointestinal:  Negative for abdominal distention, abdominal pain and constipation.  Genitourinary: Negative.   Musculoskeletal:        See HPI  Neurological:  Positive for numbness.  Psychiatric/Behavioral:  Negative for behavioral problems and dysphoric  mood.     Objective:  BP 96/66   Pulse 74   Ht 5' 1 (1.549 m)   Wt 145 lb 6.4 oz (66 kg)   SpO2 99%   BMI 27.47 kg/m      08/24/2024   11:21 AM 04/28/2024    9:23 AM 02/22/2024   11:08 AM  BP/Weight  Systolic BP 96 100 98  Diastolic BP 66 62 64  Wt. (Lbs) 145.4 150 149.8  BMI 27.47 kg/m2 28.34 kg/m2 28.3 kg/m2      Physical Exam Constitutional:      Appearance: She is well-developed.  Cardiovascular:     Rate and Rhythm: Normal rate.     Heart sounds: Normal heart sounds. No murmur heard. Pulmonary:     Effort: Pulmonary effort is normal.     Breath sounds: Normal breath sounds. No wheezing or rales.  Chest:     Chest wall: No tenderness.  Abdominal:     General: Bowel sounds are normal. There is no distension.     Palpations: Abdomen is soft. There is no mass.     Tenderness: There is no abdominal tenderness.  Musculoskeletal:        General: Normal range of motion.     Right lower leg: No edema.     Left lower leg: No edema.  Neurological:     Mental Status: She is alert and oriented to person, place, and time.     Motor: No weakness.     Coordination: Coordination normal.     Gait: Gait normal.     Deep Tendon Reflexes: Reflexes normal.  Psychiatric:        Mood and Affect: Mood normal.        Latest Ref Rng & Units 09/28/2023    9:40 AM 07/31/2022   12:27 PM 04/01/2022    4:03 PM  CMP  Glucose 70 - 99 mg/dL 899  67  87   BUN 6 - 20 mg/dL 11  12  13    Creatinine 0.57 - 1.00 mg/dL 9.38  9.42  9.38   Sodium 134 - 144 mmol/L 140  136  136   Potassium 3.5 - 5.2 mmol/L 4.1  4.2  3.6   Chloride 96 - 106 mmol/L 103  103  104   CO2 20 - 29 mmol/L 23  22  24    Calcium  8.7 - 10.2 mg/dL 9.5  8.8  9.5   Total Protein 6.0 - 8.5 g/dL 7.1  5.7  6.9   Total Bilirubin 0.0 - 1.2 mg/dL 0.3  <9.7  0.5   Alkaline Phos 44 - 121 IU/L 65  57  35   AST 0 - 40 IU/L 16  13  16    ALT 0 - 32 IU/L 14  10  14      Lipid Panel     Component Value Date/Time   CHOL  204 (H)  09/28/2023 0940   TRIG 116 09/28/2023 0940   HDL 46 09/28/2023 0940   LDLCALC 137 (H) 09/28/2023 0940    CBC    Component Value Date/Time   WBC 5.5 09/28/2023 0940   WBC 9.8 11/02/2022 0535   RBC 4.69 09/28/2023 0940   RBC 3.27 (L) 11/02/2022 0535   HGB 13.9 09/28/2023 0940   HCT 43.9 09/28/2023 0940   PLT 213 09/28/2023 0940   MCV 94 09/28/2023 0940   MCH 29.6 09/28/2023 0940   MCH 30.3 11/02/2022 0535   MCHC 31.7 09/28/2023 0940   MCHC 34.4 11/02/2022 0535   RDW 12.4 09/28/2023 0940   LYMPHSABS 2.0 09/28/2023 0940   MONOABS 0.6 04/01/2022 1603   EOSABS 0.1 09/28/2023 0940   BASOSABS 0.0 09/28/2023 0940    Lab Results  Component Value Date   HGBA1C 5.9 (H) 09/28/2023       Assessment & Plan 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 Right lower extremity numbness monthly. Differential includes fracture, arthritis, or cauda equina syndrome. - Order MRI of the pelvis to evaluate coccyx and rule out cauda equina syndrome. - Continue ibuprofen  for pain management.   Hyperlipidemia Hyperlipidemia management ongoing. Last labs over a year ago. - Order labs to check kidney function, liver function, and cholesterol levels. - Schedule fasting lab appointment.  Migraines Doing well on nortriptyline  and Imitrex  - Management per neurology    Meds ordered this encounter  Medications   atorvastatin  (LIPITOR) 20 MG tablet    Sig: Take 1 tablet (20 mg total) by mouth daily.    Dispense:  90 tablet    Refill:  1    Follow-up: Return in about 6 months (around 02/21/2025) for Chronic medical conditions.       Corrina Sabin, MD, FAAFP. [facility] [place], [place] [phone]   08/24/2024, 12:37 PM

## 2024-08-24 NOTE — Patient Instructions (Signed)
Tailbone Injury  The tailbone is the small bone at the lower end of the spine. The tailbone is also called the coccyx.A tailbone injury may involve stretched ligaments, bruising, or a broken bone/break (fracture). Tailbone injuries can be painful, and some may take a long time to heal. What are the causes? This condition may be caused by: Falling and landing on the tailbone. Repeated strain or friction from sitting for long periods of time. This may include actions such as rowing or bicycling. Childbirth. In some cases, the cause may not be known. What are the signs or symptoms? Symptoms of this condition include: Pain in the tailbone area or lower back, especially when sitting. Pain or difficulty when standing up from a sitting position. Bruising or swelling in the tailbone area. Painful bowel movements. In females, pain during sex. How is this diagnosed? This condition may be diagnosed based on your symptoms and a physical exam. If your health care provider suspects a fracture, you may have additional tests, such as: X-rays. CT scan. MRI. How is this treated? Most tailbone injuries heal on their own in 4-6 weeks. However, recovery time may be longer if there is a fracture. If treatment is needed, it may include: NSAIDs or other over-the-counter medicines to help relieve your pain. Rubber or inflated ring or cushion to take pressure off the tailbone when sitting. Physical therapy. Injection with local anesthesia and steroid medicine. This is done only if the pain does not improve over time with over-the-counter pain medicines. Follow these instructions at home: Activity Rest as told by your health care provider. Do not sit for a long time without moving. Get up to take short walks every 1-2 hours. This will improve blood flow and breathing. Ask for help if you feel weak or unsteady. Wear appropriate padding and sports gear when bicycling or rowing. This will prevent repeating an  injury that is caused by strain or friction. Do exercises as told by your health care provider. Increase your activity as the pain allows. Return to your normal activities as told by your health care provider. Ask your health care provider what activities are safe for you. Managing pain, stiffness, and swelling To help decrease discomfort when sitting: Sit on your rubber or inflated ring or cushion as told by your health care provider. Lean forward when you sit. If directed, put ice on the injured area. To do this: Put ice in a plastic bag. Place a towel between your skin and the bag. Leave the ice on for 20 minutes, 2-3 times per day for the first 1-2 days. If your skin turns bright red, remove the ice right away to prevent skin damage. The risk of skin damage is higher if you cannot feel pain, heat, or cold. If directed, apply heat to the affected area as often as told by your health care provider. Use the heat source that your health care provider recommends, such as a moist heat pack or a heating pad. Place a towel between your skin and the heat source. Leave the heat on for 20-30 minutes. If your skin turns bright red, remove the heat right away to prevent burns. The risk of burns is higher if you cannot feel pain, heat, or cold. General instructions Take over-the-counter and prescription medicines only as told by your health care provider. Your condition or medicine may cause constipation or painful bowel movements. To prevent or treat constipation, you may need to: Drink enough fluid to keep your urine pale yellow.   Take over-the-counter or prescription medicines. Eat foods that are high in fiber, such as beans, whole grains, and fresh fruits and vegetables. Limit foods that are high in fat and processed sugars, such as fried or sweet foods. Contact a health care provider if: Your pain becomes worse or is not controlled with medicine. Your bowel movements cause a great deal of  discomfort. You are unable to have a bowel movement after 4 days. You have pain during sex. Summary A tailbone injury may involve stretched ligaments, bruising, or a broken bone/break (fracture). Tailbone injuries can be painful. Most heal on their own in 4-6 weeks. Treatment may include taking NSAIDs, doing physical therapy, or using a rubber or inflated ring or cushion when sitting. Follow any recommendations from your health care provider to prevent or treat constipation. This information is not intended to replace advice given to you by your health care provider. Make sure you discuss any questions you have with your health care provider. Document Revised: 02/24/2022 Document Reviewed: 02/24/2022 Elsevier Patient Education  2024 [employer] Inc.  

## 2024-08-30 ENCOUNTER — Ambulatory Visit: Payer: Self-pay | Attending: Family Medicine

## 2024-08-30 ENCOUNTER — Ambulatory Visit (HOSPITAL_COMMUNITY): Admission: RE | Admit: 2024-08-30 | Payer: Self-pay | Source: Ambulatory Visit

## 2024-08-30 DIAGNOSIS — E785 Hyperlipidemia, unspecified: Secondary | ICD-10-CM

## 2024-08-30 DIAGNOSIS — G43809 Other migraine, not intractable, without status migrainosus: Secondary | ICD-10-CM

## 2024-08-31 ENCOUNTER — Ambulatory Visit: Payer: Self-pay | Admitting: Family Medicine

## 2024-08-31 LAB — CMP14+EGFR
ALT: 11 IU/L (ref 0–32)
AST: 14 IU/L (ref 0–40)
Albumin: 4.4 g/dL (ref 3.9–4.9)
Alkaline Phosphatase: 58 IU/L (ref 41–116)
BUN/Creatinine Ratio: 18 (ref 9–23)
BUN: 13 mg/dL (ref 6–20)
Bilirubin Total: 0.6 mg/dL (ref 0.0–1.2)
CO2: 21 mmol/L (ref 20–29)
Calcium: 9.3 mg/dL (ref 8.7–10.2)
Chloride: 101 mmol/L (ref 96–106)
Creatinine, Ser: 0.72 mg/dL (ref 0.57–1.00)
Globulin, Total: 2.6 g/dL (ref 1.5–4.5)
Glucose: 94 mg/dL (ref 70–99)
Potassium: 4.5 mmol/L (ref 3.5–5.2)
Sodium: 139 mmol/L (ref 134–144)
Total Protein: 7 g/dL (ref 6.0–8.5)
eGFR: 110 mL/min/1.73 (ref >59)

## 2024-08-31 LAB — CBC WITH DIFFERENTIAL/PLATELET
Basophils Absolute: 0 x10E3/uL (ref 0.0–0.2)
Basos: 1 %
EOS (ABSOLUTE): 0.1 x10E3/uL (ref 0.0–0.4)
Eos: 2 %
Hematocrit: 43.3 % (ref 34.0–46.6)
Hemoglobin: 13.8 g/dL (ref 11.1–15.9)
Immature Grans (Abs): 0 x10E3/uL (ref 0.0–0.1)
Immature Granulocytes: 0 %
Lymphocytes Absolute: 2.2 x10E3/uL (ref 0.7–3.1)
Lymphs: 38 %
MCH: 29.6 pg (ref 26.6–33.0)
MCHC: 31.9 g/dL (ref 31.5–35.7)
MCV: 93 fL (ref 79–97)
Monocytes Absolute: 0.4 x10E3/uL (ref 0.1–0.9)
Monocytes: 6 %
Neutrophils Absolute: 3 x10E3/uL (ref 1.4–7.0)
Neutrophils: 53 %
Platelets: 214 x10E3/uL (ref 150–450)
RBC: 4.67 x10E6/uL (ref 3.77–5.28)
RDW: 12.2 % (ref 11.7–15.4)
WBC: 5.7 x10E3/uL (ref 3.4–10.8)

## 2024-08-31 LAB — LP+NON-HDL CHOLESTEROL
Cholesterol, Total: 192 mg/dL (ref 100–199)
HDL: 47 mg/dL (ref >39)
LDL Chol Calc (NIH): 129 mg/dL — ABNORMAL HIGH (ref 0–99)
Total Non-HDL-Chol (LDL+VLDL): 145 mg/dL — ABNORMAL HIGH (ref 0–129)
Triglycerides: 89 mg/dL (ref 0–149)
VLDL Cholesterol Cal: 16 mg/dL (ref 5–40)

## 2024-10-30 NOTE — Progress Notes (Unsigned)
 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   Subjective:  Rebecca Schneider is a 38 year old right-handed female who follows up for headache.  She is accompanied by a Spanish-speaking interpreter.  UPDATE: Prescribed nortriptyline  and sumatriptan .  She never started the nortriptyline  because she has taken antidepressants in the past and they caused side effects.  She finished physical therapy for her jaw.  Still perform self-massage and wearing a mouth guard.    Headaches have decreased but feels that cold weather aggravates it.    Duration:  15-30 minutes with sumatriptan  Frequency:  1 over the past month  Current NSAIDS/analgesics:  none Current triptans:  sumatriptan  50mg  (has not needed for awhile) Current ergotamine:  none Current anti-emetic:  none Current muscle relaxants:  none Current Antihypertensive medications:  none Current Antidepressant medications:  none Current Anticonvulsant medications:  none Current anti-CGRP:  none Current Vitamins/Herbal/Supplements:  none Current Antihistamines/Decongestants:  none Other therapy:  none Birth control/hormone:  none   HISTORY: She began experiencing bilateral ear pain and ringing during her pregnancy in 2023.  Following delivery of her baby, the ear pain continued, described as a stabbing pain in both ears and associated with ringing.  Ears sometimes feels stuffed, sometimes hears cracking sound in ears.  No hearing loss.  She also has an aching on the top of her head.  She also has bilateral jaw pain.  She has episodes of severe headache where she will have stabbing pain radiating from the top of her head  back to the left side of her neck.  Sometimes she has diffuse headache like a brain freeze.  She has associated vertigo described as spinning sensation with movement.  She also endorses watery eyes, photophobia and phonophobia.  Cold aggravates it, so if it is cold outside, she needs to cover her ears and head.  Therefore, they occur less during the summer months.  This lasts for 8 days once a month.  There is an association with her menstrual cycle.  She did see a dentist who prescribed physical therapy for her jaw which has somewhat helped.    Denies prior history of headaches before 2023.  Past NSAIDS/analgesics:  ibuprofen , naproxen , acetaminophen Past abortive triptans:  none Past abortive ergotamine:  none Past muscle relaxants:  tizanidine  Past anti-emetic:  metoclopramide , ondansetron  Past antihypertensive medications:  none Past antidepressant medications:  sertraline  Past anticonvulsant medications:  topiramate  50mg  BID  Past anti-CGRP:  none Past vitamins/Herbal/Supplements:  none Past antihistamines/decongestants:  meclizine  25mg , diphenhydramine  Other past therapies:  none    PAST MEDICAL HISTORY: Past Medical History:  Diagnosis Date   Hypotension    began after delivery of baby 11/01/14    MEDICATIONS: Current Outpatient Medications on File Prior to Visit  Medication Sig Dispense Refill   atorvastatin  (LIPITOR) 20 MG tablet Take 1 tablet (20 mg total) by mouth daily. 90 tablet 1   norethindrone  (MICRONOR ) 0.35 MG tablet Take 1 tablet (0.35 mg total) by mouth daily. 28 tablet 11   nortriptyline  (PAMELOR ) 10 MG capsule Take 1 capsule (10 mg total) by mouth at bedtime. 30 capsule 5   SUMAtriptan  (IMITREX ) 50 MG tablet Take 1 tablet (50 mg total) by mouth as needed for migraine.  May repeat in 2 hours if headache persists or recurs.  Maximum 2 tablets in 24 hours. 10 tablet 5   No current facility-administered medications on file prior to visit.     ALLERGIES: Allergies  Allergen Reactions   Ibuprofen  Other (See Comments)    Possible headaches   Tylenol [Acetaminophen] Other (See Comments)    Headaches resulted    FAMILY HISTORY: Family History  Problem Relation Age of Onset   Cancer Brother        neck   Diabetes Maternal Grandfather       Objective:  Blood pressure (!) 96/51, pulse 75, height 5' 1 (1.549 m), weight 150 lb (68 kg), SpO2 98%, not currently breastfeeding. General: No acute distress.  Patient appears well-groomed.      Juliene Dunnings, DO  CC: Corrina Sabin, MD

## 2024-10-31 ENCOUNTER — Ambulatory Visit: Payer: Self-pay | Admitting: Neurology

## 2024-10-31 ENCOUNTER — Encounter: Payer: Self-pay | Admitting: Neurology

## 2024-10-31 VITALS — BP 96/51 | HR 75 | Ht 61.0 in | Wt 150.0 lb

## 2024-10-31 DIAGNOSIS — G43109 Migraine with aura, not intractable, without status migrainosus: Secondary | ICD-10-CM

## 2024-10-31 DIAGNOSIS — M26609 Unspecified temporomandibular joint disorder, unspecified side: Secondary | ICD-10-CM

## 2024-10-31 MED ORDER — SUMATRIPTAN SUCCINATE 50 MG PO TABS: 50.0000 mg | ORAL_TABLET | ORAL | 5 refills | Status: AC | PRN

## 2024-10-31 NOTE — Patient Instructions (Signed)
 Sumatriptan as needed

## 2024-11-14 ENCOUNTER — Ambulatory Visit: Payer: Self-pay

## 2024-11-14 NOTE — Telephone Encounter (Signed)
 FYI Only or Action Required?: FYI only for provider: UC advised.  Patient was last seen in primary care on 08/24/2024 by Newlin, Enobong, MD.  Called Nurse Triage reporting Cough.  Symptoms began several days ago.  Interventions attempted: OTC medications: Theraflu.  Symptoms are: stable.  Triage Disposition: See Physician Within 24 Hours  Patient/caregiver understands and will follow disposition?: Yes Reason for Disposition  Coughing up rusty-colored (reddish-brown) sputum  Answer Assessment - Initial Assessment Questions Interpretor on the line. Patient reports taking Theraflu, no relief. No available appointments in PCP office, advised UC for symptoms.   1. ONSET: When did the cough begin?      2 days ago  2. SPUTUM: Describe the color of your sputum (e.g., none, dry cough; clear, white, yellow, green)     A lot, brownish  3. DIFFICULTY BREATHING: Are you having difficulty breathing? If Yes, ask: How bad is it? (e.g., mild, moderate, severe)      Just a little  4. FEVER: Do you have a fever? If Yes, ask: What is your temperature, how was it measured, and when did it start?     Unable to assess, has goose bump and sweating  5. CARDIAC HISTORY: Do you have any history of heart disease? (e.g., heart attack, congestive heart failure)      Denies  6. LUNG HISTORY: Do you have any history of lung disease?  (e.g., pulmonary embolus, asthma, emphysema)     Denies  7. OTHER SYMPTOMS: Do you have any other symptoms? (e.g., runny nose, wheezing, chest pain)       Sore throat, earache, chest pain from coughing, feet hurt  Protocols used: Cough - Acute Productive-A-AH  Translator   Copied from CRM #[id number]. Topic: Clinical - Red Word Triage >> Nov 14, 2024  8:40 AM Gustabo D wrote: Pt has the flu for 3 days- pt wanting to be seen today nothing available. Pt sounds really bad says she has a lot of coughing and chest pain and her ears. >> Nov 14, 2024  8:50 AM  Gustabo D wrote: Brown flem

## 2024-11-15 ENCOUNTER — Ambulatory Visit (HOSPITAL_BASED_OUTPATIENT_CLINIC_OR_DEPARTMENT_OTHER): Payer: Self-pay | Admitting: Family Medicine

## 2024-11-15 ENCOUNTER — Telehealth: Payer: Self-pay

## 2024-11-15 ENCOUNTER — Encounter: Payer: Self-pay | Admitting: Family Medicine

## 2024-11-15 DIAGNOSIS — J101 Influenza due to other identified influenza virus with other respiratory manifestations: Secondary | ICD-10-CM

## 2024-11-15 LAB — POC COVID19/FLU A&B COMBO
Covid Antigen, POC: NEGATIVE
Influenza A Antigen, POC: POSITIVE — AB
Influenza B Antigen, POC: NEGATIVE

## 2024-11-15 MED ORDER — OSELTAMIVIR PHOSPHATE 75 MG PO CAPS: 75.0000 mg | ORAL_CAPSULE | Freq: Two times a day (BID) | ORAL | 0 refills | Status: DC

## 2024-11-15 MED ORDER — BENZONATATE 100 MG PO CAPS: 100.0000 mg | ORAL_CAPSULE | Freq: Three times a day (TID) | ORAL | 0 refills | Status: AC | PRN

## 2024-11-15 MED ORDER — OSELTAMIVIR PHOSPHATE 75 MG PO CAPS: 75.0000 mg | ORAL_CAPSULE | Freq: Two times a day (BID) | ORAL | 0 refills | Status: AC

## 2024-11-15 NOTE — Telephone Encounter (Signed)
 The medications were sent to the preferred pharmacy as noted in the TE.

## 2024-11-15 NOTE — Telephone Encounter (Signed)
 Medication has been changed and patient has been informed.

## 2024-11-15 NOTE — Progress Notes (Signed)
 Virtual Visit via Video Note  I connected with Rebecca Schneider, on 11/15/2024 at 12:07 PM by video enabled telemedicine device and verified that I am speaking with the correct person using two identifiers.   Consent: I discussed the limitations, risks, security and privacy concerns of performing an evaluation and management service by telemedicine and the availability of in person appointments. I also discussed with the patient that there may be a patient responsible charge related to this service. The patient expressed understanding and agreed to proceed.   Location of Patient: Home  Location of Provider: Clinic   Persons participating in Telemedicine visit: Rebecca Schneider Dr. Delbert    Discussed the use of AI scribe software for clinical note transcription with the patient, who gave verbal consent to proceed.  History of Present Illness Rebecca Schneider is a 38 year old female with a history of migraines, hyperlipidemia who presents with throat congestion and phlegm.  Since Sunday morning (3 days ago) she has had severe throat congestion with heavy phlegm that turned brown by Monday, along with body aches and significant ear pain. She has a severe cough and burning throat and woke today with a runny nose. She has mild headaches, chills, and fatigue, but no fever or facial pain. She has used Tylenol and Theraflux for symptom relief. She is worried about transmitting illness to her children, though they are not currently sick.      Past Medical History:  Diagnosis Date   Hypotension    began after delivery of baby 11/01/14   Allergies[1]  Medications Ordered Prior to Encounter[2]  ROS: See HPI  Observations/Objective: Awake, alert, oriented x3 Not in acute distress, ill looking. Normal mood      Latest Ref Rng & Units 08/30/2024   10:06 AM 09/28/2023    9:40 AM 07/31/2022   12:27 PM  CMP  Glucose 70 - 99 mg/dL 94  899  67   BUN 6 - 20  mg/dL 13  11  12    Creatinine 0.57 - 1.00 mg/dL 9.27  9.38  9.42   Sodium 134 - 144 mmol/L 139  140  136   Potassium 3.5 - 5.2 mmol/L 4.5  4.1  4.2   Chloride 96 - 106 mmol/L 101  103  103   CO2 20 - 29 mmol/L 21  23  22    Calcium  8.7 - 10.2 mg/dL 9.3  9.5  8.8   Total Protein 6.0 - 8.5 g/dL 7.0  7.1  5.7   Total Bilirubin 0.0 - 1.2 mg/dL 0.6  0.3  <9.7   Alkaline Phos 41 - 116 IU/L 58  65  57   AST 0 - 40 IU/L 14  16  13    ALT 0 - 32 IU/L 11  14  10      Lipid Panel     Component Value Date/Time   CHOL 192 08/30/2024 1006   TRIG 89 08/30/2024 1006   HDL 47 08/30/2024 1006   LDLCALC 129 (H) 08/30/2024 1006   LABVLDL 16 08/30/2024 1006    Lab Results  Component Value Date   HGBA1C 5.9 (H) 09/28/2023     Assessment and plan: 1. Influenza A (Primary) Patient called to come into the clinic for point-of-care testing for COVID and flu.  Test returned positive for influenza A - POC Covid19/Flu A&B Antigen - benzonatate  (TESSALON ) 100 MG capsule; Take 1 capsule (100 mg total) by mouth 3 (three) times daily as needed for cough.  Dispense: 15 capsule; Refill: 0 - oseltamivir  (TAMIFLU ) 75 MG capsule; Take 1 capsule (75 mg total) by mouth 2 (two) times daily.  Dispense: 10 capsule; Refill: 0     Meds ordered this encounter  Medications   DISCONTD: oseltamivir  (TAMIFLU ) 75 MG capsule    Sig: Take 1 capsule (75 mg total) by mouth 2 (two) times daily.    Dispense:  10 capsule    Refill:  0   DISCONTD: benzonatate  (TESSALON ) 100 MG capsule    Sig: Take 1 capsule (100 mg total) by mouth 3 (three) times daily as needed for cough.    Dispense:  15 capsule    Refill:  0   benzonatate  (TESSALON ) 100 MG capsule    Sig: Take 1 capsule (100 mg total) by mouth 3 (three) times daily as needed for cough.    Dispense:  15 capsule    Refill:  0   oseltamivir  (TAMIFLU ) 75 MG capsule    Sig: Take 1 capsule (75 mg total) by mouth 2 (two) times daily.    Dispense:  10 capsule    Refill:  0     Follow Up Instructions: Keep previously scheduled appointment   I discussed the assessment and treatment plan with the patient. The patient was provided an opportunity to ask questions and all were answered. The patient agreed with the plan and demonstrated an understanding of the instructions.   The patient was advised to call back or seek an in-person evaluation if the symptoms worsen or if the condition fails to improve as anticipated.     I provided 13 minutes total of Telehealth time during this encounter including median intraservice time, reviewing previous notes, investigations, ordering medications, medical decision making, coordinating care and patient verbalized understanding at the end of the visit.     Corrina Sabin, MD, FAAFP. [facility] [place], [place] [phone]   11/15/2024, 12:07 PM     [1]  Allergies Allergen Reactions   Ibuprofen  Other (See Comments)    Possible headaches   Tylenol [Acetaminophen] Other (See Comments)    Headaches resulted  [2]  Current Outpatient Medications on File Prior to Visit  Medication Sig Dispense Refill   atorvastatin  (LIPITOR) 20 MG tablet Take 1 tablet (20 mg total) by mouth daily. 90 tablet 1   SUMAtriptan  (IMITREX ) 50 MG tablet Take 1 tablet (50 mg total) by mouth as needed for migraine. May repeat in 2 hours if headache persists or recurs.  Maximum 2 tablets in 24 hours. 10 tablet 5   No current facility-administered medications on file prior to visit.

## 2024-11-15 NOTE — Patient Instructions (Signed)

## 2024-11-15 NOTE — Telephone Encounter (Signed)
 Copied from CRM #[id number]. Topic: Clinical - Prescription Issue >> Nov 15, 2024 10:53 AM Avram MATSU wrote: Reason for CRM: patient would like for both if these medication to be switch to [facility] oseltamivir  (TAMIFLU ) 75 MG capsule [[id number]] benzonatate  (TESSALON ) 100 MG capsule [[id number]]  [facility] [address], [place], [place] [address] Phone: [phone]

## 2025-02-21 ENCOUNTER — Ambulatory Visit: Payer: Self-pay | Admitting: Family Medicine

## 2025-05-02 ENCOUNTER — Ambulatory Visit: Payer: Self-pay | Admitting: Neurology
